# Patient Record
Sex: Female | Born: 1995 | Race: Black or African American | Hispanic: No | Marital: Single | Smoking: Never smoker
Health system: Southern US, Community
[De-identification: ages and names within clinical notes are randomized; demographics above are authoritative.]

## PROBLEM LIST (undated history)

## (undated) DIAGNOSIS — O139 Gestational [pregnancy-induced] hypertension without significant proteinuria, unspecified trimester: Secondary | ICD-10-CM

## (undated) DIAGNOSIS — R87629 Unspecified abnormal cytological findings in specimens from vagina: Secondary | ICD-10-CM

## (undated) DIAGNOSIS — Z789 Other specified health status: Secondary | ICD-10-CM

## (undated) HISTORY — DX: Unspecified abnormal cytological findings in specimens from vagina: R87.629

## (undated) HISTORY — PX: TONSILLECTOMY: SUR1361

## (undated) HISTORY — DX: Other specified health status: Z78.9

## (undated) HISTORY — PX: WISDOM TOOTH EXTRACTION: SHX21

## (undated) HISTORY — DX: Gestational (pregnancy-induced) hypertension without significant proteinuria, unspecified trimester: O13.9

---
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
## 2016-10-23 ENCOUNTER — Encounter (HOSPITAL_BASED_OUTPATIENT_CLINIC_OR_DEPARTMENT_OTHER): Payer: Self-pay | Admitting: *Deleted

## 2016-10-23 ENCOUNTER — Emergency Department (HOSPITAL_BASED_OUTPATIENT_CLINIC_OR_DEPARTMENT_OTHER)
Admission: EM | Admit: 2016-10-23 | Discharge: 2016-10-23 | Disposition: A | Discharge status: Home / Self Care | Payer: Medicaid Other | Attending: Emergency Medicine | Admitting: Emergency Medicine

## 2016-10-23 DIAGNOSIS — R55 Syncope and collapse: Secondary | ICD-10-CM

## 2016-10-23 DIAGNOSIS — D509 Iron deficiency anemia, unspecified: Secondary | ICD-10-CM | POA: Insufficient documentation

## 2016-10-23 LAB — BASIC METABOLIC PANEL
ANION GAP: 5 (ref 5–15)
BUN: 15 mg/dL (ref 6–20)
CHLORIDE: 103 mmol/L (ref 101–111)
CO2: 28 mmol/L (ref 22–32)
Calcium: 9 mg/dL (ref 8.9–10.3)
Creatinine, Ser: 0.76 mg/dL (ref 0.44–1.00)
GFR calc non Af Amer: >60 mL/min (ref >60)
Glucose, Bld: 110 mg/dL — ABNORMAL HIGH (ref 65–99)
POTASSIUM: 3.9 mmol/L (ref 3.5–5.1)
SODIUM: 136 mmol/L (ref 135–145)

## 2016-10-23 LAB — CBC WITH DIFFERENTIAL/PLATELET
BASOS PCT: 0 %
Basophils Absolute: 0 10*3/uL (ref 0.0–0.1)
Eosinophils Absolute: 0.3 10*3/uL (ref 0.0–0.7)
Eosinophils Relative: 3 %
HCT: 31.7 % — ABNORMAL LOW (ref 36.0–46.0)
HEMOGLOBIN: 10.1 g/dL — AB (ref 12.0–15.0)
LYMPHS PCT: 22 %
Lymphs Abs: 1.9 10*3/uL (ref 0.7–4.0)
MCH: 21.1 pg — AB (ref 26.0–34.0)
MCHC: 31.9 g/dL (ref 30.0–36.0)
MCV: 66.3 fL — AB (ref 78.0–100.0)
MONO ABS: 0.7 10*3/uL (ref 0.1–1.0)
MONOS PCT: 8 %
NEUTROS PCT: 67 %
Neutro Abs: 5.7 10*3/uL (ref 1.7–7.7)
PLATELETS: 321 10*3/uL (ref 150–400)
RBC: 4.78 MIL/uL (ref 3.87–5.11)
RDW: 16 % — ABNORMAL HIGH (ref 11.5–15.5)
WBC: 8.6 10*3/uL (ref 4.0–10.5)

## 2016-10-23 LAB — PREGNANCY, URINE: Preg Test, Ur: NEGATIVE

## 2016-10-23 MED ORDER — SODIUM CHLORIDE 0.9 % IV BOLUS (SEPSIS)
500.0000 mL | Freq: Once | INTRAVENOUS | Status: AC
  Administered 2016-10-23: 500 mL via INTRAVENOUS

## 2016-10-23 NOTE — Discharge Instructions (Signed)
Your hemoglobin was 10 today, making you anemic.  Please start taking an over the counter iron supplement. Get rechecked immediately if you develop new or worrisome symptoms.

## 2016-10-23 NOTE — ED Provider Notes (Signed)
[facility]   CSN: [id number] Arrival date & time: 10/23/16  0810     History   Chief Complaint Chief Complaint  Patient presents with  . Dizziness    HPI Joanne Harper is a 21 y.o. female.  The history is provided by the patient. No language interpreter was used.  Dizziness   Joanne Harper is a 21 y.o. female who presents to the Emergency Department complaining of near syncope, dizziness.  This Defrain reports near syncopal sensation and dizziness earlier today. When she woke up and got out of bed she felt lightheaded and felt like she might sound. She went to use the bathroom and felt nauseous and lightheaded again. She did start her menses last night but states it is lighter than usual. No chest pain, shortness of breath, abdominal pain, vomiting, leg swelling or pain. She has a history of anemia but no other medical problems. She had similar symptoms in the past and states they were related to dehydration. She is not sexually active. Overall her dizziness is feeling improved. History reviewed. No pertinent past medical history.  There are no active problems to display for this patient.   History reviewed. No pertinent surgical history.  OB History    No data available       Home Medications    Prior to Admission medications   Not on File    Family History History reviewed. No pertinent family history.  Social History Social History  Substance Use Topics  . Smoking status: Never Smoker  . Smokeless tobacco: Never Used  . Alcohol use No     Allergies   Patient has no known allergies.   Review of Systems Review of Systems  Neurological: Positive for dizziness.  All other systems reviewed and are negative.    Physical Exam Updated Vital Signs BP 134/81 (BP Location: Right Arm)   Pulse 81   Temp 98.1 F (36.7 C) (Oral)   Resp 18   Ht  (1.676 m)   Wt 266 lb (120.7 kg)   LMP 10/22/2016   SpO2 100%   BMI 42.93  kg/m   Physical Exam  Constitutional: She is oriented to person, place, and time. She appears well-developed and well-nourished.  HENT:  Head: Normocephalic and atraumatic.  Cardiovascular: Normal rate and regular rhythm.   No murmur heard. Pulmonary/Chest: Effort normal and breath sounds normal. No respiratory distress.  Abdominal: Soft. There is no tenderness. There is no rebound and no guarding.  Musculoskeletal: She exhibits no edema or tenderness.  Neurological: She is alert and oriented to person, place, and time.  Skin: Skin is warm and dry.  Psychiatric: She has a normal mood and affect. Her behavior is normal.  Nursing note and vitals reviewed.    ED Treatments / Results  Labs (all labs ordered are listed, but only abnormal results are displayed) Labs Reviewed  BASIC METABOLIC PANEL - Abnormal; Notable for the following:       Result Value   Glucose, Bld 110 (*)    All other components within normal limits  CBC WITH DIFFERENTIAL/PLATELET - Abnormal; Notable for the following:    Hemoglobin 10.1 (*)    HCT 31.7 (*)    MCV 66.3 (*)    MCH 21.1 (*)    RDW 16.0 (*)    All other components within normal limits  PREGNANCY, URINE    EKG  EKG Interpretation  Date/Time:  Monday October 23 2016 08:49:08 EDT  Ventricular Rate:  72 PR Interval:    QRS Duration: 93 QT Interval:  377 QTC Calculation: 413 R Axis:   78 Text Interpretation:  Sinus rhythm Baseline wander in lead(s) II V6 Confirmed by Lincoln Brigham [phone]) on 10/23/2016 9:01:55 AM       Radiology No results found.  Procedures Procedures (including critical care time)  Medications Ordered in ED Medications  sodium chloride 0.9 % bolus 500 mL (500 mLs Intravenous New Bag/Given 10/23/16 0900)     Initial Impression / Assessment and Plan / ED Course  I have reviewed the triage vital signs and the nursing notes.  Pertinent labs & imaging results that were available during my care of the patient were  reviewed by me and considered in my medical decision making (see chart for details).     Patient here for a near syncopal episode that happened prior to ED arrival. She is asymptomatic in the emergency department. Presentation is not consistent with PE, cardiac arrhythmia. CBC demonstrates anemia, slightly worse compared to priors. No hemorrhage based on patient history or presentation.  D/w patient home care for near syncope as well as anemia, likely iron deficient. Discussed outpatient follow-up and return precautions.  Final Clinical Impressions(s) / ED Diagnoses   Final diagnoses:  Near syncope  Microcytic anemia    New Prescriptions New Prescriptions   No medications on file     Tilden Fossa, MD 10/23/16 [phone]

## 2016-10-23 NOTE — ED Triage Notes (Signed)
Pt reports starting her menses last night, "and it always makes me feel bad", this am felt fine when she got up, but when she went into the bathroom she noticed that she was feeling dizzy. Denies any n/v/fevers, denies any uti, has used one pad this am, reports "light bleeding".

## 2017-07-03 ENCOUNTER — Other Ambulatory Visit: Payer: Self-pay

## 2017-07-03 ENCOUNTER — Emergency Department (HOSPITAL_BASED_OUTPATIENT_CLINIC_OR_DEPARTMENT_OTHER)
Admission: EM | Admit: 2017-07-03 | Discharge: 2017-07-03 | Disposition: A | Discharge status: Home / Self Care | Payer: Self-pay | Attending: Emergency Medicine | Admitting: Emergency Medicine

## 2017-07-03 ENCOUNTER — Encounter (HOSPITAL_BASED_OUTPATIENT_CLINIC_OR_DEPARTMENT_OTHER): Payer: Self-pay | Admitting: Emergency Medicine

## 2017-07-03 ENCOUNTER — Emergency Department (HOSPITAL_BASED_OUTPATIENT_CLINIC_OR_DEPARTMENT_OTHER): Payer: Self-pay

## 2017-07-03 DIAGNOSIS — J189 Pneumonia, unspecified organism: Secondary | ICD-10-CM | POA: Insufficient documentation

## 2017-07-03 DIAGNOSIS — J181 Lobar pneumonia, unspecified organism: Secondary | ICD-10-CM

## 2017-07-03 LAB — PREGNANCY, URINE: Preg Test, Ur: NEGATIVE

## 2017-07-03 MED ORDER — AZITHROMYCIN 250 MG PO TABS: ORAL_TABLET | ORAL | 0 refills | Status: DC

## 2017-07-03 MED ORDER — CEFTRIAXONE SODIUM 1 G IJ SOLR
1.0000 g | Freq: Once | INTRAMUSCULAR | Status: AC
  Administered 2017-07-03: 1 g via INTRAMUSCULAR
  Filled 2017-07-03: qty 10

## 2017-07-03 MED ORDER — BENZONATATE 100 MG PO CAPS: 100.0000 mg | ORAL_CAPSULE | Freq: Three times a day (TID) | ORAL | 0 refills | Status: DC

## 2017-07-03 MED ORDER — BENZONATATE 100 MG PO CAPS
100.0000 mg | ORAL_CAPSULE | Freq: Once | ORAL | Status: AC
  Administered 2017-07-03: 100 mg via ORAL
  Filled 2017-07-03: qty 1

## 2017-07-03 MED ORDER — LIDOCAINE HCL (PF) 1 % IJ SOLN
INTRAMUSCULAR | Status: AC
  Administered 2017-07-03: 2.1 mL
  Filled 2017-07-03: qty 5

## 2017-07-03 NOTE — Discharge Instructions (Signed)
Tessalon for cough. Motrin or tylenol for fever.

## 2017-07-03 NOTE — ED Provider Notes (Signed)
[facility] EMERGENCY DEPARTMENT Provider Note   CSN: [id number] Arrival date & time: 07/03/17  1249     History   Chief Complaint Chief Complaint  Patient presents with  . Cough    HPI Joanne Harper is a 21 y.o. female. 2 complaint is cough and fever.  HPI 21 year old female. Presents healthy. Started with cough 4 days ago. Shakes and chills yesterday. Nonproductive cough. States she feels short of breath when she exerts. No hemoptysis. She was constipated but did have a bowel movement this morning. Did not get a flu vaccination.  History reviewed. No pertinent past medical history.  There are no active problems to display for this patient.   History reviewed. No pertinent surgical history.  OB History    No data available       Home Medications    Prior to Admission medications   Medication Sig Start Date End Date Taking? Authorizing Provider  azithromycin (ZITHROMAX Z-PAK) 250 MG tablet As directed 07/03/17   Rolland PorterJames, Ramie Palladino, MD  benzonatate (TESSALON) 100 MG capsule Take 1 capsule (100 mg total) by mouth every 8 (eight) hours. 07/03/17   Rolland PorterJames, Odysseus Cada, MD    Family History History reviewed. No pertinent family history.  Social History Social History   Tobacco Use  . Smoking status: Never Smoker  . Smokeless tobacco: Never Used  Substance Use Topics  . Alcohol use: No  . Drug use: No     Allergies   Patient has no known allergies.   Review of Systems Review of Systems  Constitutional: Positive for fever. Negative for appetite change, chills, diaphoresis and fatigue.  HENT: Negative for mouth sores, sore throat and trouble swallowing.   Eyes: Negative for visual disturbance.  Respiratory: Positive for cough and shortness of breath. Negative for chest tightness and wheezing.   Cardiovascular: Negative for chest pain.  Gastrointestinal: Negative for abdominal distention, abdominal pain, diarrhea, nausea and vomiting.  Endocrine: Negative for  polydipsia, polyphagia and polyuria.  Genitourinary: Negative for dysuria, frequency and hematuria.  Musculoskeletal: Negative for gait problem.  Skin: Negative for color change, pallor and rash.  Neurological: Negative for dizziness, syncope, light-headedness and headaches.  Hematological: Does not bruise/bleed easily.  Psychiatric/Behavioral: Negative for behavioral problems and confusion.     Physical Exam Updated Vital Signs BP (!) 157/91 (BP Location: Left Arm)   Pulse 88   Temp 99.6 F (37.6 C) (Oral)   Resp 18   Ht 5\' 7"  (1.702 m)   Wt 123.4 kg (272 lb)   LMP 05/20/2017   SpO2 98%   BMI 42.60 kg/m   Physical Exam  Constitutional: She is oriented to person, place, and time. She appears well-developed and well-nourished. No distress.  Oral temp 101.3.  HENT:  Head: Normocephalic.  Eyes: Conjunctivae are normal. Pupils are equal, round, and reactive to light. No scleral icterus.  Neck: Normal range of motion. Neck supple. No thyromegaly present.  Cardiovascular: Normal rate and regular rhythm. Exam reveals no gallop and no friction rub.  No murmur heard. Pulmonary/Chest: Effort normal and breath sounds normal. No respiratory distress. She has no wheezes. She has no rales.  Left basilar rhonchi. No prolongation. No increased work of breathing. Saturating 99%.  Abdominal: Soft. Bowel sounds are normal. She exhibits no distension. There is no tenderness. There is no rebound.  Musculoskeletal: Normal range of motion.  Neurological: She is alert and oriented to person, place, and time.  Skin: Skin is warm and dry. No rash noted.  Psychiatric: She has a normal mood and affect. Her behavior is normal.     ED Treatments / Results  Labs (all labs ordered are listed, but only abnormal results are displayed) Labs Reviewed  PREGNANCY, URINE    EKG  EKG Interpretation None       Radiology Dg Chest 2 View  Result Date: 07/03/2017 CLINICAL DATA:  Cough EXAM: CHEST  2  VIEW COMPARISON:  None. FINDINGS: Patchy atelectasis versus minimal infiltrate at the left base. No pleural effusion. Normal heart size. Negative for a pneumothorax. IMPRESSION: Patchy atelectasis versus minimal infiltrate at the left lung base Electronically Signed   By: Jasmine PangKim  Fujinaga M.D.   On: 07/03/2017 14:05    Procedures Procedures (including critical care time)  Medications Ordered in ED Medications  cefTRIAXone (ROCEPHIN) injection 1 g (1 g Intramuscular Given 07/03/17 1502)  benzonatate (TESSALON) capsule 100 mg (100 mg Oral Given 07/03/17 1502)  lidocaine (PF) (XYLOCAINE) 1 % injection (2.1 mLs  Given 07/03/17 1503)     Initial Impression / Assessment and Plan / ED Course  I have reviewed the triage vital signs and the nursing notes.  Pertinent labs & imaging results that were available during my care of the patient were reviewed by me and considered in my medical decision making (see chart for details).    Discussed increasing fluids and fiber for constipation. She has a benign abdominal exam. Chest x-ray shows left basilar infiltrate. Plan will be I am Rocephin and Zithromax at home. Primary care follow-up. Motrin or Haldol for fever. Tessalon for cough. ER with worsening.  Final Clinical Impressions(s) / ED Diagnoses   Final diagnoses:  Community acquired pneumonia of left lower lobe of lung [facility](HCC)    ED Discharge Orders        Ordered    azithromycin (ZITHROMAX Z-PAK) 250 MG tablet     07/03/17 1534    benzonatate (TESSALON) 100 MG capsule  Every 8 hours     07/03/17 1534       Rolland PorterJames, Khalila Buechner, MD 07/03/17 1546

## 2017-07-03 NOTE — ED Triage Notes (Signed)
Patient states that on Sunday she started to have a cough and "getting winded" on Sunday - the patient also reports that she is having a constipation . Denies any fever - but chills

## 2017-10-09 ENCOUNTER — Encounter (HOSPITAL_BASED_OUTPATIENT_CLINIC_OR_DEPARTMENT_OTHER): Payer: Self-pay | Admitting: Emergency Medicine

## 2017-10-09 ENCOUNTER — Other Ambulatory Visit: Payer: Self-pay

## 2017-10-09 ENCOUNTER — Emergency Department (HOSPITAL_BASED_OUTPATIENT_CLINIC_OR_DEPARTMENT_OTHER)
Admission: EM | Admit: 2017-10-09 | Discharge: 2017-10-09 | Disposition: A | Discharge status: Home / Self Care | Payer: Self-pay | Attending: Emergency Medicine | Admitting: Emergency Medicine

## 2017-10-09 DIAGNOSIS — J039 Acute tonsillitis, unspecified: Secondary | ICD-10-CM | POA: Insufficient documentation

## 2017-10-09 DIAGNOSIS — J302 Other seasonal allergic rhinitis: Secondary | ICD-10-CM | POA: Insufficient documentation

## 2017-10-09 MED ORDER — LORATADINE 10 MG PO TABS: 10.0000 mg | ORAL_TABLET | Freq: Every day | ORAL | 0 refills | Status: DC

## 2017-10-09 MED ORDER — PENICILLIN G BENZATHINE 1200000 UNIT/2ML IM SUSP
1.2000 10*6.[IU] | Freq: Once | INTRAMUSCULAR | Status: AC
  Administered 2017-10-09: 1.2 10*6.[IU] via INTRAMUSCULAR
  Filled 2017-10-09: qty 2

## 2017-10-09 NOTE — ED Triage Notes (Signed)
Pt c/o sore throat x 6 days

## 2017-10-09 NOTE — ED Provider Notes (Signed)
[facility] EMERGENCY DEPARTMENT Provider Note   CSN: [id number] Arrival date & time: 10/09/17  1010     History   Chief Complaint Chief Complaint  Patient presents with  . Sore Throat    HPI Joanne Harper is a 22 y.o. female.  HPI Patient presents with 6 days of sore throat.  Pain is worse with swallowing.  Denies fever or chills.  She has had nasal congestion and clear rhinorrhea.  Thinks this may be related to pollen.  Denies shortness of breath or cough.  No new rashes.  Younger sister whom she lives with recently developed sore throat.  No other known sick contacts. History reviewed. No pertinent past medical history.  There are no active problems to display for this patient.   History reviewed. No pertinent surgical history.   OB History   None      Home Medications    Prior to Admission medications   Medication Sig Start Date End Date Taking? Authorizing Provider  azithromycin (ZITHROMAX Z-PAK) 250 MG tablet As directed 07/03/17   Rolland Porter, MD  benzonatate (TESSALON) 100 MG capsule Take 1 capsule (100 mg total) by mouth every 8 (eight) hours. 07/03/17   Rolland Porter, MD  loratadine (CLARITIN) 10 MG tablet Take 1 tablet (10 mg total) by mouth daily. 10/09/17   Loren Racer, MD    Family History No family history on file.  Social History Social History   Tobacco Use  . Smoking status: Never Smoker  . Smokeless tobacco: Never Used  Substance Use Topics  . Alcohol use: No  . Drug use: No     Allergies   Patient has no known allergies.   Review of Systems Review of Systems  Constitutional: Negative for chills and fever.  HENT: Positive for congestion, rhinorrhea, sinus pressure and sore throat. Negative for trouble swallowing.   Respiratory: Negative for cough and shortness of breath.   Gastrointestinal: Negative for abdominal pain and nausea.  Musculoskeletal: Positive for neck pain. Negative for arthralgias, myalgias and neck  stiffness.  Skin: Negative for rash.  Neurological: Negative for weakness, light-headedness, numbness and headaches.  All other systems reviewed and are negative.    Physical Exam Updated Vital Signs BP 138/90   Pulse 77   Temp 98.5 F (36.9 C) (Oral)   Resp 16   Ht 5\' 6"  (1.676 m)   Wt 123.8 kg (273 lb)   LMP 10/02/2017   SpO2 99%   BMI 44.06 kg/m   Physical Exam  Constitutional: She is oriented to person, place, and time. She appears well-developed and well-nourished. No distress.  HENT:  Head: Normocephalic and atraumatic.  Mouth/Throat: Oropharynx is clear and moist.  Bilateral tonsillar hypertrophy and erythema.  Uvula is midline.  No exudates appreciated.  Patient does appear to have some small shallow ulcerations of the soft palate and tonsils.  Eyes: Pupils are equal, round, and reactive to light. EOM are normal.  Neck: Normal range of motion. Neck supple.  Right greater than left cervical lymphadenopathy.  No meningismus.  No stridor.  Cardiovascular: Normal rate and regular rhythm.  Pulmonary/Chest: Effort normal and breath sounds normal. She has no wheezes.  Abdominal: Soft. Bowel sounds are normal. There is no tenderness. There is no rebound and no guarding.  Musculoskeletal: Normal range of motion. She exhibits no edema or tenderness.  Lymphadenopathy:    She has cervical adenopathy.  Neurological: She is alert and oriented to person, place, and time.  Skin: Skin  is warm and dry. No rash noted. She is not diaphoretic. No erythema.  Psychiatric: She has a normal mood and affect. Her behavior is normal.  Nursing note and vitals reviewed.    ED Treatments / Results  Labs (all labs ordered are listed, but only abnormal results are displayed) Labs Reviewed - No data to display  EKG None  Radiology No results found.  Procedures Procedures (including critical care time)  Medications Ordered in ED Medications  penicillin g benzathine (BICILLIN LA)  1200000 UNIT/2ML injection 1.2 Million Units (has no administration in time range)     Initial Impression / Assessment and Plan / ED Course  I have reviewed the triage vital signs and the nursing notes.  Pertinent labs & imaging results that were available during my care of the patient were reviewed by me and considered in my medical decision making (see chart for details).     Patient with allergic rhinitis and tonsillitis.  Given sister has similar symptoms and probable strep throat, will treat with IM penicillin.  Also placed on Claritin.  Advised to continue ibuprofen and Tylenol as needed for pain.  Return precautions given.  Final Clinical Impressions(s) / ED Diagnoses   Final diagnoses:  Tonsillitis  Seasonal allergic rhinitis, unspecified trigger    ED Discharge Orders        Ordered    loratadine (CLARITIN) 10 MG tablet  Daily     10/09/17 1049       Loren RacerYelverton, Jaidan Stachnik, MD 10/09/17 1055

## 2018-01-31 ENCOUNTER — Emergency Department (HOSPITAL_COMMUNITY)
Admission: EM | Admit: 2018-01-31 | Discharge: 2018-02-01 | Disposition: A | Discharge status: Home / Self Care | Payer: Medicaid Other | Attending: Emergency Medicine | Admitting: Emergency Medicine

## 2018-01-31 ENCOUNTER — Encounter (HOSPITAL_COMMUNITY): Payer: Self-pay

## 2018-01-31 DIAGNOSIS — D509 Iron deficiency anemia, unspecified: Secondary | ICD-10-CM | POA: Insufficient documentation

## 2018-01-31 DIAGNOSIS — R197 Diarrhea, unspecified: Secondary | ICD-10-CM

## 2018-01-31 DIAGNOSIS — R634 Abnormal weight loss: Secondary | ICD-10-CM

## 2018-01-31 DIAGNOSIS — R1030 Lower abdominal pain, unspecified: Secondary | ICD-10-CM

## 2018-01-31 DIAGNOSIS — Z79899 Other long term (current) drug therapy: Secondary | ICD-10-CM | POA: Insufficient documentation

## 2018-01-31 LAB — CBC WITH DIFFERENTIAL/PLATELET
ABS IMMATURE GRANULOCYTES: 0 10*3/uL (ref 0.0–0.1)
BASOS ABS: 0 10*3/uL (ref 0.0–0.1)
BASOS PCT: 0 %
Eosinophils Absolute: 0.2 10*3/uL (ref 0.0–0.7)
Eosinophils Relative: 2 %
HEMATOCRIT: 36.1 % (ref 36.0–46.0)
HEMOGLOBIN: 11 g/dL — AB (ref 12.0–15.0)
IMMATURE GRANULOCYTES: 1 %
Lymphocytes Relative: 33 %
Lymphs Abs: 2.6 10*3/uL (ref 0.7–4.0)
MCH: 21.3 pg — ABNORMAL LOW (ref 26.0–34.0)
MCHC: 30.5 g/dL (ref 30.0–36.0)
MCV: 70 fL — ABNORMAL LOW (ref 78.0–100.0)
MONOS PCT: 7 %
Monocytes Absolute: 0.6 10*3/uL (ref 0.1–1.0)
Neutro Abs: 4.6 10*3/uL (ref 1.7–7.7)
Neutrophils Relative %: 57 %
Platelets: 310 10*3/uL (ref 150–400)
RBC: 5.16 MIL/uL — ABNORMAL HIGH (ref 3.87–5.11)
RDW: 15.3 % (ref 11.5–15.5)
WBC: 8 10*3/uL (ref 4.0–10.5)

## 2018-01-31 LAB — I-STAT BETA HCG BLOOD, ED (MC, WL, AP ONLY): I-stat hCG, quantitative: <5 m[IU]/mL (ref <5)

## 2018-01-31 LAB — URINALYSIS, ROUTINE W REFLEX MICROSCOPIC
Bacteria, UA: NONE SEEN
Bilirubin Urine: NEGATIVE
GLUCOSE, UA: NEGATIVE mg/dL
Hgb urine dipstick: NEGATIVE
Ketones, ur: NEGATIVE mg/dL
NITRITE: NEGATIVE
PH: 6 (ref 5.0–8.0)
Protein, ur: NEGATIVE mg/dL
SPECIFIC GRAVITY, URINE: 1.026 (ref 1.005–1.030)

## 2018-01-31 LAB — COMPREHENSIVE METABOLIC PANEL
ALT: 14 U/L (ref 0–44)
AST: 14 U/L — ABNORMAL LOW (ref 15–41)
Albumin: 3.8 g/dL (ref 3.5–5.0)
Alkaline Phosphatase: 58 U/L (ref 38–126)
Anion gap: 10 (ref 5–15)
BUN: 16 mg/dL (ref 6–20)
CALCIUM: 9.3 mg/dL (ref 8.9–10.3)
CO2: 24 mmol/L (ref 22–32)
Chloride: 105 mmol/L (ref 98–111)
Creatinine, Ser: 0.9 mg/dL (ref 0.44–1.00)
GFR calc non Af Amer: >60 mL/min (ref >60)
Glucose, Bld: 100 mg/dL — ABNORMAL HIGH (ref 70–99)
Potassium: 4.2 mmol/L (ref 3.5–5.1)
Sodium: 139 mmol/L (ref 135–145)
Total Bilirubin: 0.6 mg/dL (ref 0.3–1.2)
Total Protein: 7.5 g/dL (ref 6.5–8.1)

## 2018-01-31 LAB — LIPASE, BLOOD: LIPASE: 26 U/L (ref 11–51)

## 2018-01-31 NOTE — ED Triage Notes (Signed)
Pt reports that she has had a recent weight loss of 40 lbs in the past month, the last three days has been having diarrhea, no abd pain or fevers, some nausea. Pt tearful in triage and reports stress at home

## 2018-01-31 NOTE — ED Provider Notes (Signed)
[facility] EMERGENCY DEPARTMENT Provider Note   CSN: [id number] Arrival date & time: 01/31/18  2040     History   Chief Complaint Chief Complaint  Patient presents with  . Weight Loss  . Diarrhea    HPI Joanne Harper is a 22 y.o. female.  The history is provided by the patient.  She has noted some anorexia and weight loss over the last month.  She estimates a 40 pound weight loss.  Over the last 3 days, she has had some crampy suprapubic pain and diarrhea.  She is having approximately 1 watery bowel movement today.  Abdominal pain does ease following with bowel movement, but then does come back.  There has been some mild nausea but no vomiting.  She is worried about possible pregnancy.  She does use condoms for contraception.  Last menses was approximately 1 month ago.  She has noted some breast tenderness.  She denies any fever or chills.  She denies any blood or mucus in the stool.  She has been under a lot of stress.  History reviewed. No pertinent past medical history.  There are no active problems to display for this patient.   History reviewed. No pertinent surgical history.   OB History   None      Home Medications    Prior to Admission medications   Medication Sig Start Date End Date Taking? Authorizing Provider  azithromycin (ZITHROMAX Z-PAK) 250 MG tablet As directed 07/03/17   Rolland Porter, MD  benzonatate (TESSALON) 100 MG capsule Take 1 capsule (100 mg total) by mouth every 8 (eight) hours. 07/03/17   Rolland Porter, MD  loratadine (CLARITIN) 10 MG tablet Take 1 tablet (10 mg total) by mouth daily. 10/09/17   Loren Racer, MD    Family History No family history on file.  Social History Social History   Tobacco Use  . Smoking status: Never Smoker  . Smokeless tobacco: Never Used  Substance Use Topics  . Alcohol use: No  . Drug use: No     Allergies   Penicillins   Review of Systems Review of Systems  All other systems  reviewed and are negative.    Physical Exam Updated Vital Signs BP (!) 133/96 (BP Location: Right Arm)   Pulse 75   Temp 99.1 F (37.3 C)   Resp 18   LMP 01/07/2018   SpO2 100%   Physical Exam  Nursing note and vitals reviewed.  21 year old female, resting comfortably and in no acute distress. Vital signs are significant for mild diastolic blood pressure elevation. Oxygen saturation is 100%, which is normal. Head is normocephalic and atraumatic. PERRLA, EOMI. Oropharynx is clear. Neck is nontender and supple without adenopathy or JVD. Back is nontender and there is no CVA tenderness. Lungs are clear without rales, wheezes, or rhonchi. Chest is nontender. Heart has regular rate and rhythm without murmur. Abdomen is soft, flat, nontender without masses or hepatosplenomegaly and peristalsis is normoactive. Extremities have no cyanosis or edema, full range of motion is present. Skin is warm and dry without rash. Neurologic: Mental status is normal, cranial nerves are intact, there are no motor or sensory deficits.  ED Treatments / Results  Labs (all labs ordered are listed, but only abnormal results are displayed) Labs Reviewed  COMPREHENSIVE METABOLIC PANEL - Abnormal; Notable for the following components:      Result Value   Glucose, Bld 100 (*)    AST 14 (*)  All other components within normal limits  CBC WITH DIFFERENTIAL/PLATELET - Abnormal; Notable for the following components:   RBC 5.16 (*)    Hemoglobin 11.0 (*)    MCV 70.0 (*)    MCH 21.3 (*)    All other components within normal limits  URINALYSIS, ROUTINE W REFLEX MICROSCOPIC - Abnormal; Notable for the following components:   Leukocytes, UA SMALL (*)    All other components within normal limits  LIPASE, BLOOD  I-STAT BETA HCG BLOOD, ED ([facility], [facility], [facility] ONLY)   Procedures Procedures   Medications Ordered in ED Medications - No data to display   Initial Impression / Assessment and Plan / ED Course  I have  reviewed the triage vital signs and the nursing notes.  Pertinent lab results that were available during my care of the patient were reviewed by me and considered in my medical decision making (see chart for details).  Weight loss, abdominal pain, diarrhea of uncertain cause.  Screening labs have been obtained prior to my seeing her and are significant only for mild microcytic anemia which is presumably secondary to menstrual blood loss.  Hemoglobin is stable over approximately 7-year period.  She is not pregnant.  I have explained these findings to the patient.  Cause for her diarrhea and weight loss are not clear.  Recommended that she avoid milk products as doctor's intolerance could cause some of her symptoms.  She is referred to gastroenterology for further outpatient work-up.  Old records are reviewed, and she has no relevant past visits.  Final Clinical Impressions(s) / ED Diagnoses   Final diagnoses:  Diarrhea, unspecified type  Weight loss  Lower abdominal pain  Microcytic anemia    ED Discharge Orders    None       Dione BoozeGlick, Marchia Diguglielmo, MD 02/01/18 [phone]

## 2018-01-31 NOTE — ED Notes (Signed)
ED Provider at bedside. 

## 2018-01-31 NOTE — ED Provider Notes (Signed)
MSE was initiated and I personally evaluated the patient and placed orders (if any) at  8:53 PM on January 31, 2018.  The patient appears stable so that the remainder of the MSE may be completed by another provider.  Patient placed in Quick Look pathway, seen and evaluated   Chief Complaint: Diarrhea x3 days  HPI:   [age] year old female presents to ED for evaluation of diarrhea for the past 3 days, decreased appetite for the past month.  States that she has had a 40 pound weight loss this past month.  She attributes this to increased stressors in her life and "just forgetting to eat, I have to make myself eat sometimes."  She denies any hematochezia or melena.  Reports nausea with no vomiting.  No sick contacts with similar symptoms.  Patient states that she has her mother and sister at home for support.  She denies any SI, HI.  ROS: Diarrhea  Physical Exam:   Gen: No distress  Neuro: Awake and Alert  Skin: Warm    Focused Exam: No abdominal tenderness to palpation.  Tachycardic.  Tearful during exam.   Initiation of care has begun. The patient has been counseled on the process, plan, and necessity for staying for the completion/evaluation, and the remainder of the medical screening examination    Dietrich PatesKhatri, Aryahi Denzler, PA-C 01/31/18 2055    Jacalyn LefevreHaviland, Julie, MD 01/31/18 2126

## 2018-02-01 ENCOUNTER — Other Ambulatory Visit: Payer: Self-pay

## 2018-02-01 NOTE — Discharge Instructions (Addendum)
Avoid milk products - including cheese, ice cream, yogurt, etc.

## 2019-07-11 NOTE — L&D Delivery Note (Signed)
Delivery Note Joanne Harper is a G1P0 at [redacted]w[redacted]d who had a spontaneous delivery at 15:06 on 03/24/20,  a viable female "Joanne Harper" was delivered via ROA.  APGAR: 9, 9; weight 2145g (4lb11.7oz).   Joanne Harper was admitted for IOL due to [facility], diagnosed with preeclampsia on admission by [facility]. Induced with cytotec x2, then SROM'ed, and pitocin was given. Progressed normally. Pushed for 5 minutes. Baby was delivered without difficulty. No nuchal cord. Placed on maternal abdomen. Delayed cord clamping for 60 seconds. Delivery of placenta was spontaneous. Placenta was found to be intact, 3-vessel cord was noted. The fundus was found to be firm. 1st degree perineal laceration was repaired with 2-0 vicryl in a figure of 8 fashion. Estimated blood loss 150cc. Instrument and gauze counts were correct at the end of the procedure. She did not receive an epidural for pain management.  Placenta status: pathology Mom to postpartum.  Baby to Couplet care / Skin to Skin.  Joanne Harper 03/24/2020, 3:27 PM

## 2019-10-03 LAB — OB RESULTS CONSOLE RUBELLA ANTIBODY, IGM: Rubella: IMMUNE

## 2019-10-03 LAB — OB RESULTS CONSOLE HIV ANTIBODY (ROUTINE TESTING): HIV: NONREACTIVE

## 2019-10-03 LAB — OB RESULTS CONSOLE ANTIBODY SCREEN: Antibody Screen: NEGATIVE

## 2019-10-03 LAB — OB RESULTS CONSOLE GC/CHLAMYDIA
Chlamydia: NEGATIVE
Gonorrhea: NEGATIVE

## 2019-10-03 LAB — OB RESULTS CONSOLE HEPATITIS B SURFACE ANTIGEN: Hepatitis B Surface Ag: NEGATIVE

## 2019-10-03 LAB — OB RESULTS CONSOLE RPR: RPR: NONREACTIVE

## 2019-10-03 LAB — OB RESULTS CONSOLE ABO/RH: RH Type: POSITIVE

## 2020-01-23 LAB — OB RESULTS CONSOLE RPR: RPR: NONREACTIVE

## 2020-02-17 ENCOUNTER — Encounter: Payer: Self-pay | Admitting: *Deleted

## 2020-02-17 ENCOUNTER — Other Ambulatory Visit: Payer: Self-pay | Admitting: *Deleted

## 2020-02-17 ENCOUNTER — Other Ambulatory Visit: Payer: Self-pay | Admitting: Obstetrics and Gynecology

## 2020-02-17 ENCOUNTER — Other Ambulatory Visit: Payer: Self-pay

## 2020-02-17 ENCOUNTER — Ambulatory Visit: Payer: Medicaid Other | Admitting: *Deleted

## 2020-02-17 ENCOUNTER — Ambulatory Visit: Payer: Medicaid Other | Attending: Obstetrics and Gynecology

## 2020-02-17 VITALS — BP 133/85 | HR 92

## 2020-02-17 DIAGNOSIS — O99213 Obesity complicating pregnancy, third trimester: Secondary | ICD-10-CM | POA: Diagnosis not present

## 2020-02-17 DIAGNOSIS — Z364 Encounter for antenatal screening for fetal growth retardation: Secondary | ICD-10-CM

## 2020-02-17 DIAGNOSIS — O365990 Maternal care for other known or suspected poor fetal growth, unspecified trimester, not applicable or unspecified: Secondary | ICD-10-CM | POA: Diagnosis not present

## 2020-02-17 DIAGNOSIS — Z3A31 31 weeks gestation of pregnancy: Secondary | ICD-10-CM

## 2020-02-17 DIAGNOSIS — Z362 Encounter for other antenatal screening follow-up: Secondary | ICD-10-CM

## 2020-02-17 DIAGNOSIS — E669 Obesity, unspecified: Secondary | ICD-10-CM | POA: Diagnosis not present

## 2020-03-09 ENCOUNTER — Other Ambulatory Visit: Payer: Self-pay | Admitting: *Deleted

## 2020-03-09 ENCOUNTER — Ambulatory Visit: Payer: Medicaid Other | Admitting: *Deleted

## 2020-03-09 ENCOUNTER — Ambulatory Visit: Payer: Medicaid Other | Attending: Obstetrics and Gynecology

## 2020-03-09 ENCOUNTER — Other Ambulatory Visit: Payer: Self-pay

## 2020-03-09 ENCOUNTER — Encounter: Payer: Self-pay | Admitting: *Deleted

## 2020-03-09 VITALS — BP 140/96 | HR 73

## 2020-03-09 DIAGNOSIS — Z362 Encounter for other antenatal screening follow-up: Secondary | ICD-10-CM | POA: Insufficient documentation

## 2020-03-09 DIAGNOSIS — Z3A34 34 weeks gestation of pregnancy: Secondary | ICD-10-CM

## 2020-03-09 DIAGNOSIS — O99213 Obesity complicating pregnancy, third trimester: Secondary | ICD-10-CM

## 2020-03-09 DIAGNOSIS — Z3689 Encounter for other specified antenatal screening: Secondary | ICD-10-CM | POA: Diagnosis present

## 2020-03-09 DIAGNOSIS — E669 Obesity, unspecified: Secondary | ICD-10-CM

## 2020-03-09 DIAGNOSIS — Z364 Encounter for antenatal screening for fetal growth retardation: Secondary | ICD-10-CM | POA: Diagnosis not present

## 2020-03-18 LAB — OB RESULTS CONSOLE GBS: GBS: NEGATIVE

## 2020-03-19 ENCOUNTER — Telehealth (HOSPITAL_COMMUNITY): Payer: Self-pay | Admitting: *Deleted

## 2020-03-19 ENCOUNTER — Encounter (HOSPITAL_COMMUNITY): Payer: Self-pay | Admitting: *Deleted

## 2020-03-19 NOTE — Telephone Encounter (Signed)
Preadmission screen  

## 2020-03-22 ENCOUNTER — Other Ambulatory Visit (HOSPITAL_COMMUNITY)
Admission: RE | Admit: 2020-03-22 | Discharge: 2020-03-22 | Disposition: A | Discharge status: Home / Self Care | Payer: Medicaid Other | Source: Ambulatory Visit | Attending: Obstetrics & Gynecology | Admitting: Obstetrics & Gynecology

## 2020-03-22 DIAGNOSIS — Z20822 Contact with and (suspected) exposure to covid-19: Secondary | ICD-10-CM | POA: Diagnosis not present

## 2020-03-22 DIAGNOSIS — Z01812 Encounter for preprocedural laboratory examination: Secondary | ICD-10-CM | POA: Insufficient documentation

## 2020-03-22 DIAGNOSIS — O139 Gestational [pregnancy-induced] hypertension without significant proteinuria, unspecified trimester: Secondary | ICD-10-CM | POA: Diagnosis present

## 2020-03-22 LAB — SARS CORONAVIRUS 2 (TAT 6-24 HRS): SARS Coronavirus 2: NEGATIVE

## 2020-03-23 ENCOUNTER — Other Ambulatory Visit: Payer: Self-pay | Admitting: Obstetrics and Gynecology

## 2020-03-24 ENCOUNTER — Other Ambulatory Visit: Payer: Self-pay

## 2020-03-24 ENCOUNTER — Encounter (HOSPITAL_COMMUNITY): Payer: Self-pay | Admitting: Obstetrics & Gynecology

## 2020-03-24 ENCOUNTER — Inpatient Hospital Stay (HOSPITAL_COMMUNITY)
Admission: AD | Admit: 2020-03-24 | Discharge: 2020-03-27 | DRG: 807 | Disposition: A | Discharge status: Home / Self Care | Payer: Medicaid Other | Attending: Obstetrics & Gynecology | Admitting: Obstetrics & Gynecology

## 2020-03-24 ENCOUNTER — Inpatient Hospital Stay (HOSPITAL_COMMUNITY): Payer: Medicaid Other

## 2020-03-24 DIAGNOSIS — O133 Gestational [pregnancy-induced] hypertension without significant proteinuria, third trimester: Secondary | ICD-10-CM

## 2020-03-24 DIAGNOSIS — O1414 Severe pre-eclampsia complicating childbirth: Secondary | ICD-10-CM | POA: Diagnosis present

## 2020-03-24 DIAGNOSIS — Z3A37 37 weeks gestation of pregnancy: Secondary | ICD-10-CM | POA: Diagnosis not present

## 2020-03-24 DIAGNOSIS — O9902 Anemia complicating childbirth: Secondary | ICD-10-CM | POA: Diagnosis present

## 2020-03-24 DIAGNOSIS — O139 Gestational [pregnancy-induced] hypertension without significant proteinuria, unspecified trimester: Secondary | ICD-10-CM | POA: Diagnosis present

## 2020-03-24 DIAGNOSIS — O1493 Unspecified pre-eclampsia, third trimester: Secondary | ICD-10-CM

## 2020-03-24 DIAGNOSIS — O99214 Obesity complicating childbirth: Secondary | ICD-10-CM | POA: Diagnosis present

## 2020-03-24 DIAGNOSIS — O1490 Unspecified pre-eclampsia, unspecified trimester: Secondary | ICD-10-CM | POA: Diagnosis not present

## 2020-03-24 DIAGNOSIS — E669 Obesity, unspecified: Secondary | ICD-10-CM | POA: Diagnosis present

## 2020-03-24 LAB — COMPREHENSIVE METABOLIC PANEL
ALT: 17 U/L (ref 0–44)
ALT: 17 U/L (ref 0–44)
AST: 32 U/L (ref 15–41)
AST: 20 U/L (ref 15–41)
Albumin: 2.7 g/dL — ABNORMAL LOW (ref 3.5–5.0)
Albumin: 2.4 g/dL — ABNORMAL LOW (ref 3.5–5.0)
Alkaline Phosphatase: 138 U/L — ABNORMAL HIGH (ref 38–126)
Alkaline Phosphatase: 142 U/L — ABNORMAL HIGH (ref 38–126)
Anion gap: 10 (ref 5–15)
Anion gap: 8 (ref 5–15)
BUN: 15 mg/dL (ref 6–20)
BUN: 10 mg/dL (ref 6–20)
CO2: 19 mmol/L — ABNORMAL LOW (ref 22–32)
CO2: 20 mmol/L — ABNORMAL LOW (ref 22–32)
Calcium: 8.8 mg/dL — ABNORMAL LOW (ref 8.9–10.3)
Calcium: 8.5 mg/dL — ABNORMAL LOW (ref 8.9–10.3)
Chloride: 106 mmol/L (ref 98–111)
Chloride: 102 mmol/L (ref 98–111)
Creatinine, Ser: 0.9 mg/dL (ref 0.44–1.00)
Creatinine, Ser: 0.73 mg/dL (ref 0.44–1.00)
GFR calc Af Amer: >60 mL/min (ref >60)
GFR calc Af Amer: >60 mL/min (ref >60)
GFR calc non Af Amer: >60 mL/min (ref >60)
GFR calc non Af Amer: >60 mL/min (ref >60)
Glucose, Bld: 86 mg/dL (ref 70–99)
Glucose, Bld: 126 mg/dL — ABNORMAL HIGH (ref 70–99)
Potassium: 4.8 mmol/L (ref 3.5–5.1)
Potassium: 4.4 mmol/L (ref 3.5–5.1)
Sodium: 135 mmol/L (ref 135–145)
Sodium: 130 mmol/L — ABNORMAL LOW (ref 135–145)
Total Bilirubin: 0.5 mg/dL (ref 0.3–1.2)
Total Bilirubin: 0.5 mg/dL (ref 0.3–1.2)
Total Protein: 6.5 g/dL (ref 6.5–8.1)
Total Protein: 5.8 g/dL — ABNORMAL LOW (ref 6.5–8.1)

## 2020-03-24 LAB — TYPE AND SCREEN
ABO/RH(D): O POS
Antibody Screen: NEGATIVE

## 2020-03-24 LAB — CBC
HCT: 30.8 % — ABNORMAL LOW (ref 36.0–46.0)
HCT: 30.8 % — ABNORMAL LOW (ref 36.0–46.0)
HCT: 32.9 % — ABNORMAL LOW (ref 36.0–46.0)
Hemoglobin: 10.1 g/dL — ABNORMAL LOW (ref 12.0–15.0)
Hemoglobin: 9.4 g/dL — ABNORMAL LOW (ref 12.0–15.0)
Hemoglobin: 9.6 g/dL — ABNORMAL LOW (ref 12.0–15.0)
MCH: 21.7 pg — ABNORMAL LOW (ref 26.0–34.0)
MCH: 21.8 pg — ABNORMAL LOW (ref 26.0–34.0)
MCH: 22.5 pg — ABNORMAL LOW (ref 26.0–34.0)
MCHC: 30.5 g/dL (ref 30.0–36.0)
MCHC: 30.7 g/dL (ref 30.0–36.0)
MCHC: 31.2 g/dL (ref 30.0–36.0)
MCV: 70.9 fL — ABNORMAL LOW (ref 80.0–100.0)
MCV: 71.1 fL — ABNORMAL LOW (ref 80.0–100.0)
MCV: 72.3 fL — ABNORMAL LOW (ref 80.0–100.0)
Platelets: 286 10*3/uL (ref 150–400)
Platelets: 300 10*3/uL (ref 150–400)
Platelets: 354 10*3/uL (ref 150–400)
RBC: 4.26 MIL/uL (ref 3.87–5.11)
RBC: 4.33 MIL/uL (ref 3.87–5.11)
RBC: 4.64 MIL/uL (ref 3.87–5.11)
RDW: 16.5 % — ABNORMAL HIGH (ref 11.5–15.5)
RDW: 16.8 % — ABNORMAL HIGH (ref 11.5–15.5)
RDW: 17.1 % — ABNORMAL HIGH (ref 11.5–15.5)
WBC: 10.6 10*3/uL — ABNORMAL HIGH (ref 4.0–10.5)
WBC: 14.3 10*3/uL — ABNORMAL HIGH (ref 4.0–10.5)
WBC: 9.7 10*3/uL (ref 4.0–10.5)
nRBC: 0 % (ref 0.0–0.2)
nRBC: 0 % (ref 0.0–0.2)
nRBC: 0 % (ref 0.0–0.2)

## 2020-03-24 LAB — RPR: RPR Ser Ql: NONREACTIVE

## 2020-03-24 LAB — PROTEIN / CREATININE RATIO, URINE
Creatinine, Urine: 264.96 mg/dL
Protein Creatinine Ratio: 0.35 mg/mg{Cre} — ABNORMAL HIGH (ref 0.00–0.15)
Total Protein, Urine: 92 mg/dL

## 2020-03-24 MED ORDER — LACTATED RINGERS IV SOLN: INTRAVENOUS | Status: DC

## 2020-03-24 MED ORDER — LACTATED RINGERS IV SOLN: 500.0000 mL | Freq: Once | INTRAVENOUS | Status: DC

## 2020-03-24 MED ORDER — HYDRALAZINE HCL 20 MG/ML IJ SOLN: 10.0000 mg | INTRAMUSCULAR | Status: DC | PRN

## 2020-03-24 MED ORDER — DIPHENHYDRAMINE HCL 50 MG/ML IJ SOLN: 12.5000 mg | INTRAMUSCULAR | Status: DC | PRN

## 2020-03-24 MED ORDER — LABETALOL HCL 5 MG/ML IV SOLN: 80.0000 mg | INTRAVENOUS | Status: DC | PRN

## 2020-03-24 MED ORDER — FENTANYL CITRATE (PF) 100 MCG/2ML IJ SOLN
50.0000 ug | INTRAMUSCULAR | Status: DC | PRN
  Administered 2020-03-24 (×2): 100 ug via INTRAVENOUS
  Filled 2020-03-24 (×2): qty 2

## 2020-03-24 MED ORDER — OXYTOCIN-SODIUM CHLORIDE 30-0.9 UT/500ML-% IV SOLN: 2.5000 [IU]/h | INTRAVENOUS | Status: DC

## 2020-03-24 MED ORDER — OXYCODONE HCL 5 MG PO TABS: 5.0000 mg | ORAL_TABLET | ORAL | Status: DC | PRN

## 2020-03-24 MED ORDER — OXYCODONE HCL 5 MG PO TABS: 10.0000 mg | ORAL_TABLET | ORAL | Status: DC | PRN

## 2020-03-24 MED ORDER — COCONUT OIL OIL: 1.0000 "application " | TOPICAL_OIL | Status: DC | PRN

## 2020-03-24 MED ORDER — EPHEDRINE 5 MG/ML INJ: 10.0000 mg | INTRAVENOUS | Status: DC | PRN

## 2020-03-24 MED ORDER — LABETALOL HCL 5 MG/ML IV SOLN: 40.0000 mg | INTRAVENOUS | Status: DC | PRN

## 2020-03-24 MED ORDER — DOCUSATE SODIUM 100 MG PO CAPS
100.0000 mg | ORAL_CAPSULE | Freq: Two times a day (BID) | ORAL | Status: DC
  Administered 2020-03-24 – 2020-03-27 (×6): 100 mg via ORAL
  Filled 2020-03-24 (×6): qty 1

## 2020-03-24 MED ORDER — MISOPROSTOL 25 MCG QUARTER TABLET
25.0000 ug | ORAL_TABLET | ORAL | Status: DC | PRN
  Administered 2020-03-24 (×2): 25 ug via VAGINAL
  Filled 2020-03-24 (×2): qty 1

## 2020-03-24 MED ORDER — PHENYLEPHRINE 40 MCG/ML (10ML) SYRINGE FOR IV PUSH (FOR BLOOD PRESSURE SUPPORT): 80.0000 ug | PREFILLED_SYRINGE | INTRAVENOUS | Status: DC | PRN

## 2020-03-24 MED ORDER — OXYTOCIN-SODIUM CHLORIDE 30-0.9 UT/500ML-% IV SOLN
1.0000 m[IU]/min | INTRAVENOUS | Status: DC
  Administered 2020-03-24: 2 m[IU]/min via INTRAVENOUS

## 2020-03-24 MED ORDER — SODIUM CHLORIDE 0.9% FLUSH
3.0000 mL | Freq: Two times a day (BID) | INTRAVENOUS | Status: DC
  Administered 2020-03-25 – 2020-03-26 (×2): 3 mL via INTRAVENOUS

## 2020-03-24 MED ORDER — DIPHENHYDRAMINE HCL 25 MG PO CAPS: 25.0000 mg | ORAL_CAPSULE | Freq: Four times a day (QID) | ORAL | Status: DC | PRN

## 2020-03-24 MED ORDER — OXYTOCIN BOLUS FROM INFUSION
333.0000 mL | Freq: Once | INTRAVENOUS | Status: AC
  Administered 2020-03-24: 333 mL via INTRAVENOUS

## 2020-03-24 MED ORDER — ACETAMINOPHEN 325 MG PO TABS: 650.0000 mg | ORAL_TABLET | ORAL | Status: DC | PRN

## 2020-03-24 MED ORDER — LABETALOL HCL 5 MG/ML IV SOLN: 20.0000 mg | INTRAVENOUS | Status: DC | PRN

## 2020-03-24 MED ORDER — SODIUM CHLORIDE 0.9 % IV SOLN: 250.0000 mL | INTRAVENOUS | Status: DC | PRN

## 2020-03-24 MED ORDER — ONDANSETRON HCL 4 MG/2ML IJ SOLN: 4.0000 mg | INTRAMUSCULAR | Status: DC | PRN

## 2020-03-24 MED ORDER — FENTANYL-BUPIVACAINE-NACL 0.5-0.125-0.9 MG/250ML-% EP SOLN: 12.0000 mL/h | EPIDURAL | Status: DC | PRN

## 2020-03-24 MED ORDER — MAGNESIUM SULFATE BOLUS VIA INFUSION
4.0000 g | Freq: Once | INTRAVENOUS | Status: AC
  Administered 2020-03-24: 4 g via INTRAVENOUS
  Filled 2020-03-24: qty 1000

## 2020-03-24 MED ORDER — MAGNESIUM SULFATE 40 GM/1000ML IV SOLN
2.0000 g/h | INTRAVENOUS | Status: AC
  Administered 2020-03-24 – 2020-03-25 (×2): 2 g/h via INTRAVENOUS
  Filled 2020-03-24 (×2): qty 1000

## 2020-03-24 MED ORDER — LIDOCAINE HCL (PF) 1 % IJ SOLN
30.0000 mL | INTRAMUSCULAR | Status: AC | PRN
  Administered 2020-03-24: 30 mL via SUBCUTANEOUS
  Filled 2020-03-24: qty 30

## 2020-03-24 MED ORDER — OXYTOCIN-SODIUM CHLORIDE 30-0.9 UT/500ML-% IV SOLN
1.0000 m[IU]/min | INTRAVENOUS | Status: DC
  Filled 2020-03-24: qty 500

## 2020-03-24 MED ORDER — TERBUTALINE SULFATE 1 MG/ML IJ SOLN: 0.2500 mg | Freq: Once | INTRAMUSCULAR | Status: DC | PRN

## 2020-03-24 MED ORDER — SOD CITRATE-CITRIC ACID 500-334 MG/5ML PO SOLN: 30.0000 mL | ORAL | Status: DC | PRN

## 2020-03-24 MED ORDER — IBUPROFEN 600 MG PO TABS
600.0000 mg | ORAL_TABLET | Freq: Four times a day (QID) | ORAL | Status: DC
  Administered 2020-03-24 – 2020-03-27 (×11): 600 mg via ORAL
  Filled 2020-03-24 (×11): qty 1

## 2020-03-24 MED ORDER — LACTATED RINGERS IV SOLN: 500.0000 mL | INTRAVENOUS | Status: DC | PRN

## 2020-03-24 MED ORDER — PRENATAL MULTIVITAMIN CH
1.0000 | ORAL_TABLET | Freq: Every day | ORAL | Status: DC
  Administered 2020-03-25 – 2020-03-26 (×2): 1 via ORAL
  Filled 2020-03-24 (×2): qty 1

## 2020-03-24 MED ORDER — ONDANSETRON HCL 4 MG PO TABS: 4.0000 mg | ORAL_TABLET | ORAL | Status: DC | PRN

## 2020-03-24 MED ORDER — BENZOCAINE-MENTHOL 20-0.5 % EX AERO
1.0000 "application " | INHALATION_SPRAY | CUTANEOUS | Status: DC | PRN
  Administered 2020-03-24: 1 via TOPICAL
  Filled 2020-03-24: qty 56

## 2020-03-24 MED ORDER — SODIUM CHLORIDE 0.9% FLUSH: 3.0000 mL | INTRAVENOUS | Status: DC | PRN

## 2020-03-24 MED ORDER — ONDANSETRON HCL 4 MG/2ML IJ SOLN
4.0000 mg | Freq: Four times a day (QID) | INTRAMUSCULAR | Status: DC | PRN
  Administered 2020-03-24: 4 mg via INTRAVENOUS
  Filled 2020-03-24: qty 2

## 2020-03-24 MED ORDER — NIFEDIPINE ER OSMOTIC RELEASE 30 MG PO TB24
30.0000 mg | ORAL_TABLET | Freq: Every day | ORAL | Status: DC
  Administered 2020-03-24 – 2020-03-25 (×2): 30 mg via ORAL
  Filled 2020-03-24 (×2): qty 1

## 2020-03-24 MED ORDER — SIMETHICONE 80 MG PO CHEW: 80.0000 mg | CHEWABLE_TABLET | ORAL | Status: DC | PRN

## 2020-03-24 MED ORDER — WITCH HAZEL-GLYCERIN EX PADS: 1.0000 "application " | MEDICATED_PAD | CUTANEOUS | Status: DC | PRN

## 2020-03-24 MED ORDER — DIBUCAINE (PERIANAL) 1 % EX OINT: 1.0000 "application " | TOPICAL_OINTMENT | CUTANEOUS | Status: DC | PRN

## 2020-03-24 NOTE — Lactation Note (Addendum)
This note was copied from a baby's chart. Lactation Consultation Note  Patient Name: Joanne Harper [id number] Date: 03/24/2020  Mom is a G1P1. baby Joanne [name] born at [redacted] weeks gestation, SGA, weighing less than 6 pounds now 6 hours old.  Mom has not breastfed yet or pumped.  Mom reports she wants to exclusively breastfed. Mom reports she has no breastpump for home use. Sent referral to [facility] office. However mom reports she has Medicaid but has not gotten on [facility] yet Mom and [name] doing STS.  Mom reports [name] just fed formula.  Mom reports they were waiting on lactation.   Urged mom to have RN call lactation as soon as [name] cues and if she doesn't cue that she needs to eat 8-12 or more times day. Let RN know as well.  No breastpump in room to initiate pumping so RN said she would find one and start mom pumping. However, RN not able to find a pump so LC got one from 4th floor .  When LC came back infant cuing slightly.  Showed mom how to do some hand expression and spoon feeding with drops of colostrum.  Infant gaggy and spitty but still cuing.  Infant took approximately 3 ml via spoon.Assisted mom with hand expression so she could have a supplement to feed her past breastfeeding at the next feed.   Urged mom to keep her sitting up about 30 minutes.   Urged mom to pump every 3 hours past breastfeeding and/or attempted breastfeed. Reviewed and left LPTI green sheet with parents.  Showed mom how to massage and hand express and mom able to hand express drops of colostrum easily.  Put drops in [name]'s mouth. She would stick her tongue out and take them.   Reviewed and left [facility] handout.  Urged parents to call lactation as needed.   Maternal Data    Feeding Feeding Type: Bottle Fed - Formula Nipple Type: Nfant Slow Flow (purple)  LATCH Score                   Interventions    Lactation Tools Discussed/Used     Consult  Status      Jahmal Dunavant Michaelle Copas 03/24/2020, 9:17 PM

## 2020-03-24 NOTE — H&P (Addendum)
Joanne Harper is a 24 y.o. female G1P0 [redacted]w[redacted]d presenting for IOL for preeclampsia. She reports no LOF, VB, Contractions. Normal FM. Reports on/off HA, none currently. Denies SOB/RUQ pain  Pregnancy c/b: 1. Obesity (BMI 52.6) 2. Anemia (Admission H/H 9.6/30.8) 3. FOB history of heart disease: fetal echo was normal  OB History    Gravida  1   Para      Term      Preterm      AB      Living        SAB      TAB      Ectopic      Multiple      Live Births             Past Medical History:  Diagnosis Date  . Medical history non-contributory   . Pregnancy induced hypertension   . Vaginal Pap smear, abnormal    Past Surgical History:  Procedure Laterality Date  . TONSILLECTOMY    . WISDOM TOOTH EXTRACTION     Family History: family history includes Anxiety disorder in her mother; Cancer in her mother; Depression in her mother; Diabetes in her father; Hypertension in her father and mother. Social History:  reports that she has never smoked. She has never used smokeless tobacco. She reports that she does not drink alcohol and does not use drugs.     Maternal Diabetes: No Genetic Screening: Normal Maternal Ultrasounds/Referrals: Last scan (8/31) EFW 2199g, 4lb14oz (13%) Fetal Ultrasounds or other Referrals:  None Maternal Substance Abuse:  No  Significant Maternal Medications:  None Significant Maternal Lab Results:  None Other Comments:  None  Review of Systems Per HPI Exam Physical Exam  Dilation: 1 Effacement (%): 60, 70 Station: -3 Exam by:: S Moyer RN Blood pressure (!) 135/105, pulse 87, temperature 97.8 F (36.6 C), temperature source Oral, resp. rate 18, height 5\' 5"  (1.651 m), weight (!) 147.2 kg.   Patient Vitals for the past 24 hrs:  BP Temp Temp src Pulse Resp Height Weight  03/24/20 0934 (!) 135/105 -- -- 87 18 -- --  03/24/20 0700 (!) 142/103 -- -- 87 16 -- --  03/24/20 0601 138/81 -- -- (!) 102 14 -- --  03/24/20 0542 (!) 140/92 -- -- 89  15 -- --  03/24/20 0503 (!) 163/103 -- -- 91 -- -- --  03/24/20 0501 (!) 157/110 97.8 F (36.6 C) Oral 86 13 -- --  03/24/20 0401 (!) 143/90 -- -- (!) 104 16 -- --  03/24/20 0300 (!) 143/79 -- -- 95 14 -- --  03/24/20 0200 140/87 -- -- 94 15 -- --  03/24/20 0104 (!) 151/92 -- -- 80 17 -- --  03/24/20 0049 -- -- -- -- -- 5\' 5"  (1.651 m) (!) 147.2 kg  03/24/20 0020 (!) 149/101 98.3 F (36.8 C) Oral (!) 103 16 -- --    NAD, resting comfortably Gravid abdomen Fetal testing: FHR 140, mod variability, no decels, contractions q 2-13min  Recent Labs    03/24/20 0032  WBC 9.7  HGB 9.6*  HCT 30.8*  PLT 354  NA 135  K 4.8  CL 106  BUN 15  CREATININE 0.90  AST 32  ALT 17  BILITOT 0.5  Urine P/C 0.35  Prenatal labs: ABO, Rh:  --/--/O POS (09/15 0032) Antibody: NEG (09/15 0032) Rubella: Immune (03/26 0000) RPR: Nonreactive (07/16 0000)  HBsAg: Negative (03/26 0000)  HIV: Non-reactive (03/26 0000)  GBS: Negative/-- (09/09 0000)  Assessment/Plan: 24 y.o. G1P0 @ [redacted]w[redacted]d here for IOL for preeclampsia 1. Labor: S/p cytotec x 2, now s/p SROM, plan for pitocin 2. Preeclampsia: normal admission labs except for elevated urine P/C; monitor for signs/symptoms of worsening preeclampsia    Taralynn Quiett K Taam-Akelman 03/24/2020, 10:24 AM

## 2020-03-24 NOTE — Progress Notes (Signed)
Called "[place]" on [employer] and left voice message.  Pt admit BP to MBU was 163/80, repeat was 147/88.  Called to notify MD.  Pt asymptomatic.  Only having soreness r/t perieum/1st degree w/repair.  Ibuprofen given.  Will continue to monitor.

## 2020-03-24 NOTE — Progress Notes (Signed)
OBGYN Note Vitals:   03/24/20 1600 03/24/20 1630 03/24/20 1758 03/24/20 1820  BP: (!) 158/87 (!) 160/91 (!) 163/80 (!) 147/88  Pulse: 76 72 80 81  Resp:   20   Temp:  98.2 F (36.8 C) 98.2 F (36.8 C)   TempSrc:  Oral Oral   SpO2:   100% 100%  Weight:      Height:       Joanne Harper 24 y.o. G1P1001 PPD#0 s/p SVD at [redacted]w[redacted]d, IOL for preeclampsia, now with severe features by BP. Asymptomatic.. -Start Mag (4g bolus, then 2gx24h) -Start Procardia 30xL QD. Ordered IV antihypertensives PRN -CBC/CMP now and ordered for AM Joanne Harper 03/24/20 6:43 PM

## 2020-03-25 LAB — COMPREHENSIVE METABOLIC PANEL
ALT: 15 U/L (ref 0–44)
AST: 17 U/L (ref 15–41)
Albumin: 2.4 g/dL — ABNORMAL LOW (ref 3.5–5.0)
Alkaline Phosphatase: 131 U/L — ABNORMAL HIGH (ref 38–126)
Anion gap: 7 (ref 5–15)
BUN: 10 mg/dL (ref 6–20)
CO2: 22 mmol/L (ref 22–32)
Calcium: 8.1 mg/dL — ABNORMAL LOW (ref 8.9–10.3)
Chloride: 107 mmol/L (ref 98–111)
Creatinine, Ser: 0.78 mg/dL (ref 0.44–1.00)
GFR calc Af Amer: >60 mL/min (ref >60)
GFR calc non Af Amer: >60 mL/min (ref >60)
Glucose, Bld: 84 mg/dL (ref 70–99)
Potassium: 4.3 mmol/L (ref 3.5–5.1)
Sodium: 136 mmol/L (ref 135–145)
Total Bilirubin: 0.4 mg/dL (ref 0.3–1.2)
Total Protein: 5.8 g/dL — ABNORMAL LOW (ref 6.5–8.1)

## 2020-03-25 LAB — CBC
HCT: 28.1 % — ABNORMAL LOW (ref 36.0–46.0)
Hemoglobin: 8.6 g/dL — ABNORMAL LOW (ref 12.0–15.0)
MCH: 21.7 pg — ABNORMAL LOW (ref 26.0–34.0)
MCHC: 30.6 g/dL (ref 30.0–36.0)
MCV: 71 fL — ABNORMAL LOW (ref 80.0–100.0)
Platelets: 280 10*3/uL (ref 150–400)
RBC: 3.96 MIL/uL (ref 3.87–5.11)
RDW: 16.5 % — ABNORMAL HIGH (ref 11.5–15.5)
WBC: 10.9 10*3/uL — ABNORMAL HIGH (ref 4.0–10.5)
nRBC: 0.2 % (ref 0.0–0.2)

## 2020-03-25 MED ORDER — NIFEDIPINE ER OSMOTIC RELEASE 30 MG PO TB24
60.0000 mg | ORAL_TABLET | Freq: Every day | ORAL | Status: DC
  Administered 2020-03-26 – 2020-03-27 (×2): 60 mg via ORAL
  Filled 2020-03-25 (×2): qty 2

## 2020-03-25 MED ORDER — FERROUS SULFATE 325 (65 FE) MG PO TABS
325.0000 mg | ORAL_TABLET | Freq: Every day | ORAL | Status: DC
  Administered 2020-03-25 – 2020-03-27 (×3): 325 mg via ORAL
  Filled 2020-03-25 (×3): qty 1

## 2020-03-25 NOTE — Lactation Note (Signed)
This note was copied from a baby's chart. Lactation Consultation Note  Patient Name: Joanne Harper [id number] Date: 03/25/2020 Reason for consult: Follow-up assessment;Early term 37-38.6wks;1st time breastfeeding;Primapara;Infant weight loss;Infant < 6lbs;Other (Comment) (SGA)  Visited with mom of a 33 hours old ETI female, baby is  SGA and < 6 lbs. Mom is a P1, and reported to [facility] that she's going to be pumping and bottle feeding exclusively, that's why there wasn't any LATCH score on flowsheets. She's already pumping every 3 hours and got about 2 ml of colostrum in her last pumping session, praised her for her efforts.   Baby was been spoon fed (breastmilk) and she's also been supplemented with Similac 22 calorie formula. Reviewed formula supplementation guidelines for LPIs due to baby's birth weight, LC explained to parents that amounts will go up every 24 hours until the 72 hours mark. Parents will monitor for spitting up since baby is already taking 20 ml within an hour; she's at 7% weight loss. Baby is on NFant purple nipple from NICU.  Reviewed lactogenesis II, pumping schedule, formula guidelines, benefits of STS care, feeding cues and size of baby's stomach.  Feeding plan:  1. Encouraged mom to continue pumping every 3 hours, ideally 8 pumping sessions in 24 hours 2. She'll feed baby her EBM first and complete volumes required for supplementation with Similac 22 calorie formula  Dad present during [facility] consultation, when LC asked if he had questions/concerns, he said he didn't because this is not his first child, but it's mom's first baby. Parents reported all questions and concerns were answered, they're both aware of LC OP services and will call PRN.    Maternal Data    Feeding    LATCH Score                   Interventions Interventions: Breast feeding basics reviewed;DEBP;Expressed milk  Lactation Tools Discussed/Used Tools: Pump Breast pump type:  Double-Electric Breast Pump   Consult Status Consult Status: Follow-up Date: 03/26/20 Follow-up type: In-patient    Shakora Nordquist Venetia Constable 03/25/2020, 9:01 AM

## 2020-03-25 NOTE — Progress Notes (Signed)
Vitals:   03/25/20 0412 03/25/20 0559 03/25/20 0744 03/25/20 1137  BP:   (!) 142/90 (!) 151/96  Pulse:   84 (!) 101  Resp: 19 18 18 18   Temp:   98.1 F (36.7 C) 98.4 F (36.9 C)  TempSrc:   Oral Oral  SpO2:    99%  Weight:      Height:       Results for orders placed or performed during the hospital encounter of 03/24/20 (from the past 24 hour(s))  CBC     Status: Abnormal   Collection Time: 03/24/20  2:56 PM  Result Value Ref Range   WBC 10.6 (H) 4.0 - 10.5 K/uL   RBC 4.64 3.87 - 5.11 MIL/uL   Hemoglobin 10.1 (L) 12.0 - 15.0 g/dL   HCT 03/26/20 (L) 36 - 46 %   MCV 70.9 (L) 80.0 - 100.0 fL   MCH 21.8 (L) 26.0 - 34.0 pg   MCHC 30.7 30.0 - 36.0 g/dL   RDW 24.2 (H) 35.3 - 61.4 %   Platelets 300 150 - 400 K/uL   nRBC 0.0 0.0 - 0.2 %  Comprehensive metabolic panel     Status: Abnormal   Collection Time: 03/24/20  6:53 PM  Result Value Ref Range   Sodium 130 (L) 135 - 145 mmol/L   Potassium 4.4 3.5 - 5.1 mmol/L   Chloride 102 98 - 111 mmol/L   CO2 20 (L) 22 - 32 mmol/L   Glucose, Bld 126 (H) 70 - 99 mg/dL   BUN 10 6 - 20 mg/dL   Creatinine, Ser 03/26/20 0.44 - 1.00 mg/dL   Calcium 8.5 (L) 8.9 - 10.3 mg/dL   Total Protein 5.8 (L) 6.5 - 8.1 g/dL   Albumin 2.4 (L) 3.5 - 5.0 g/dL   AST 20 15 - 41 U/L   ALT 17 0 - 44 U/L   Alkaline Phosphatase 142 (H) 38 - 126 U/L   Total Bilirubin 0.5 0.3 - 1.2 mg/dL   GFR calc non Af Amer >60 >60 mL/min   GFR calc Af Amer >60 >60 mL/min   Anion gap 8 5 - 15  CBC     Status: Abnormal   Collection Time: 03/24/20  6:53 PM  Result Value Ref Range   WBC 14.3 (H) 4.0 - 10.5 K/uL   RBC 4.33 3.87 - 5.11 MIL/uL   Hemoglobin 9.4 (L) 12.0 - 15.0 g/dL   HCT 03/26/20 (L) 36 - 46 %   MCV 71.1 (L) 80.0 - 100.0 fL   MCH 21.7 (L) 26.0 - 34.0 pg   MCHC 30.5 30.0 - 36.0 g/dL   RDW 08.6 (H) 76.1 - 95.0 %   Platelets 286 150 - 400 K/uL   nRBC 0.0 0.0 - 0.2 %  CBC     Status: Abnormal   Collection Time: 03/25/20  7:45 AM  Result Value Ref Range   WBC 10.9 (H) 4.0  - 10.5 K/uL   RBC 3.96 3.87 - 5.11 MIL/uL   Hemoglobin 8.6 (L) 12.0 - 15.0 g/dL   HCT 03/27/20 (L) 36 - 46 %   MCV 71.0 (L) 80.0 - 100.0 fL   MCH 21.7 (L) 26.0 - 34.0 pg   MCHC 30.6 30.0 - 36.0 g/dL   RDW 67.1 (H) 24.5 - 80.9 %   Platelets 280 150 - 400 K/uL   nRBC 0.2 0.0 - 0.2 %  Comprehensive metabolic panel     Status: Abnormal   Collection Time: 03/25/20  7:45  AM  Result Value Ref Range   Sodium 136 135 - 145 mmol/L   Potassium 4.3 3.5 - 5.1 mmol/L   Chloride 107 98 - 111 mmol/L   CO2 22 22 - 32 mmol/L   Glucose, Bld 84 70 - 99 mg/dL   BUN 10 6 - 20 mg/dL   Creatinine, Ser 3.00 0.44 - 1.00 mg/dL   Calcium 8.1 (L) 8.9 - 10.3 mg/dL   Total Protein 5.8 (L) 6.5 - 8.1 g/dL   Albumin 2.4 (L) 3.5 - 5.0 g/dL   AST 17 15 - 41 U/L   ALT 15 0 - 44 U/L   Alkaline Phosphatase 131 (H) 38 - 126 U/L   Total Bilirubin 0.4 0.3 - 1.2 mg/dL   GFR calc non Af Amer >60 >60 mL/min   GFR calc Af Amer >60 >60 mL/min   Anion gap 7 5 - 15    BPs creeping up again.  Will increase to Procardia to 60 mg XL.

## 2020-03-25 NOTE — Progress Notes (Signed)
Patient is eating, ambulating, voiding.  Pain control is good.  Vitals:   03/25/20 0300 03/25/20 0401 03/25/20 0412 03/25/20 0559  BP: 135/73 (!) 147/90    Pulse: 83 82    Resp: 16  19 18   Temp:      TempSrc:      SpO2:      Weight:      Height:        Fundus firm Perineum without swelling.  Lab Results  Component Value Date   WBC 14.3 (H) 03/24/2020   HGB 9.4 (L) 03/24/2020   HCT 30.8 (L) 03/24/2020   MCV 71.1 (L) 03/24/2020   PLT 286 03/24/2020    --/--/O POS (09/15 0032)/RI  A/P Post partum day 1, severe preeclampsia. On magnesium sulfate- 24 hours up at 8 pm tonight. Procardia for hypertension-BPs stable Otherwise routine care.  Expect d/c one to two days.   Iron for anemia.  12-09-2005

## 2020-03-25 NOTE — Progress Notes (Signed)
Vitals:   03/25/20 1137 03/25/20 1517 03/25/20 1638 03/25/20 1955  BP: (!) 151/96 139/78 (!) 144/73 137/75  Pulse: (!) 101 (!) 108 100 96  Resp: 18 18 18 17   Temp: 98.4 F (36.9 C) 98.4 F (36.9 C) 98.2 F (36.8 C) 97.9 F (36.6 C)  TempSrc: Oral Oral Oral Oral  SpO2: 99%  98% 99%  Weight:      Height:        Procardia  60 to begin in am.

## 2020-03-26 NOTE — Progress Notes (Addendum)
Post Partum Day 60 24 year old G1 P0 admitted for induction of labor for gestational hypertension Subjective: Feeling well after magnesium discontinued.  Denies headache, vision changes, right upper quadrant or epigastric pain.  Ambulating without difficulty.  Voiding without difficulty.  No nausea or vomiting with p.o. intake  Objective: Blood pressure (!) 148/87, pulse 89, temperature 98.2 F (36.8 C), temperature source Oral, resp. rate 17, height 5\' 5"  (1.651 m), weight (!) 147.2 kg, SpO2 100 %, unknown if currently breastfeeding.  Today's Vitals   03/26/20 0030 03/26/20 0435 03/26/20 0720 03/26/20 0750  BP: 140/76 (!) 141/84 (!) 148/87   Pulse: 99 94 89   Resp: 16 16 17    Temp: 98.2 F (36.8 C) 98.5 F (36.9 C) 98.2 F (36.8 C)   TempSrc: Oral Oral Oral   SpO2: 99% 99% 100%   Weight:      Height:      PainSc:  0-No pain  0-No pain   Body mass index is 54.02 kg/m. Total I/O In: -  Out: 400 [Urine:400]   Physical Exam:  General: alert, cooperative and appears stated age Lochia: appropriate Uterine Fundus: firm Incision: healing well DVT Evaluation: No evidence DVT  Recent Labs    03/24/20 1853 03/25/20 0745  HGB 9.4* 8.6*  HCT 30.8* 28.1*    Assessment/Plan: 24 year old G1 P0 admitted for induction of labor for mild pre-eclampsia.  Developed severe features with severe range preeclampsia postpartum.  1) preeclampsia: Status post 24 hours magnesium sulfate.  Currently on Procardia 60 mg XL for hypertension control.  Labs have remained normal since admission. 2) hypertension: Blood pressures now back in the mild range at 140s/70s to 80s on 60 mg Procardia XL.  Procardia increased to 60 mg yesterday afternoon.  Will monitor for an additional 24 hours in-house to confirm adequate blood pressure control on current medication regimen 3) anemia of pregnancy: Patient's hemoglobin on admission was 9.4, now 8.6.  Patient is without symptoms.  Will continue oral iron 4)  anticipate discharge home tomorrow if blood pressures remain stable    LOS: 2 days   03/27/20 03/26/2020, 11:05 AM

## 2020-03-26 NOTE — Lactation Note (Signed)
This note was copied from a baby's chart. Lactation Consultation Note  Patient Name: Joanne Harper [id number] Date: 03/26/2020 Reason for consult: Follow-up assessment;Primapara;1st time breastfeeding;Infant < 6lbs;Early term 37-38.6wks  LC in to visit with P1 Mom of 37 wk baby at 63 hrs old.  Baby is going to breast and latching for 15-30 mins over night, followed with 22 cal. formula supplementation of 6-20 ml by slow flow bottle.  Pump set up at bedside.  Mom states she has pumped, but usually isn't pumping if baby latches to the breast.  Encouraged Mom to pump after each feeding as baby is small in size and she needs to stimulate and support a full milk supply.    Set up washing and drying bin and educated Mom on importance of washing, rinsing and air drying parts in separate bin.    Mom has not heard from [facility].  LC refaxed [facility] referral as Mom does not have a DEBP at home.    Encouraged Mom to ask for help prn.  Interventions Interventions: Breast feeding basics reviewed;Skin to skin;Breast massage;Hand express;DEBP  Lactation Tools Discussed/Used Tools: Pump;Bottle Breast pump type: Double-Electric Breast Pump   Consult Status Consult Status: Follow-up Date: 03/27/20 Follow-up type: In-patient    Judee Clara 03/26/2020, 9:11 AM

## 2020-03-26 NOTE — Lactation Note (Signed)
This note was copied from a baby's chart. Lactation Consultation Note Baby 32 hrs old at time of consult. Baby sleeping in bassinet. Spoke w/mom about the feedings. BF and supplementing as well as increasing supplemental amount. Gave mom LPI information sheet and supplementing sheet w/breast/formula feeding.   Baby hasn't been to breast since delivery. Explained to mom if baby wasn't going to the breast then the baby needed more formula/BM and can't go by supplementing amount. Mom stated understanding. Mom stated the baby hasn't been interested in BF.  LC changed baby's wet diaper, un-swaddled and placed baby to breast in football position.  Mom has Large soft breast w/semi flat nipples. Very compressible. LC placed rolled cloth under breast for added support. Hand expression demonstrated w/colostrum noted. Praised mom. Mom stated she has been giving a little colostrum in spoon then formula feeding baby w/bottle and purple nipple. Encouraged breast massage at intervals during feeding.  Discussed positioning, support, props, safety while feeding. Baby latched shallow at first. Re-latched baby. Baby has small mouth, mom has just right size nipple for the baby. The baby BF great. Mom excited.  Placed light blanket over baby d/t low temps earlier. Put hat on head and mittens. Discussed importance of keeping hat on which is difficult d/t small head.  After BF mom is to give colostrum/BM first then give 22 cal. Similac. Mom in agreement.  Baby BF great 15 min. Then spoon fed 2 ml colostrum, then gave 20 ml Similac formula. Discussed paced feeding, demonstrated how to hold baby upright and bottle placement. Noting baby taking breaks.   Mom has DEBP at bedside. Encouraged mom to pump every 3 hrs. Mom stated she has been pumping every 4 hrs because she has been so tired.  Lots of teaching done during consult. Encouraged mom to call for assistance  Or questions. Discussed importance of STS and  strict I&O for baby. Milk storage and pumping discussed.  Praised mom for all her hard work.   Patient Name: Joanne Harper IRCVE'L Date: 03/26/2020 Reason for consult: Follow-up assessment;Primapara;Early term 37-38.6wks;Infant < 6lbs;Infant weight loss   Maternal Data Has patient been taught Hand Expression?: Yes Does the patient have breastfeeding experience prior to this delivery?: No  Feeding Feeding Type: Breast Milk with Formula added Nipple Type: Nfant Slow Flow (purple)  LATCH Score Latch: Grasps breast easily, tongue down, lips flanged, rhythmical sucking.  Audible Swallowing: None  Type of Nipple: Flat  Comfort (Breast/Nipple): Soft / non-tender  Hold (Positioning): Assistance needed to correctly position infant at breast and maintain latch.  LATCH Score: 6  Interventions Interventions: Breast feeding basics reviewed;Assisted with latch;Breast compression;Skin to skin;Adjust position;Shells;Breast massage;Support pillows;DEBP;Position options;Hand express;Pre-pump if needed;Expressed milk  Lactation Tools Discussed/Used     Consult Status Consult Status: Follow-up Date: 03/26/20 Follow-up type: In-patient    Charyl Dancer 03/26/2020, 12:36 AM

## 2020-03-27 MED ORDER — NIFEDIPINE ER OSMOTIC RELEASE 60 MG PO TB24: 60.0000 mg | ORAL_TABLET | Freq: Every day | ORAL | 1 refills | Status: AC

## 2020-03-27 MED ORDER — IBUPROFEN 600 MG PO TABS: 600.0000 mg | ORAL_TABLET | Freq: Four times a day (QID) | ORAL | 0 refills | Status: AC | PRN

## 2020-03-27 NOTE — Lactation Note (Signed)
This note was copied from a baby's chart. Lactation Consultation Note  Infant is 37 weeks 70 hours old with 2% weight loss. Mom states infant has been doing well at the breast. She denies having any nipple pain. Mom has been supplementing with formula taking between 12-22 ml of Similac Neosure 22 calories. Infant has adequate output.   Mom does not have a DEBP at home. It is plan to EBF and her pump will not arrive until Tuesday. LC processed a WIC pump loaner for Mom. LC reviewed the cost of the pump if not returned as well as the return date on 9/30 on the first floor. LC gave Mom her receipt and she confirmed understanding of the loaner agreement.   Plan 1. Mom will continue to feed baby based on cues 8-12 in 24 hour period.          2. Mom to offer supplementation following the LPTI guidelines which LC reviewed with Mom and gave her the brochure.            3. Mom will continue to pump q 3 hrs for 15 minutes           4. Mom states she has enough breastmilk to supplement feedings with and received the okay to d/c formula with provider.            5. Mom's f/u appointment on Monday            6. Engorgement signs, symptoms, prevention and treatment reviewed with Mom             7. LC brochure and outpatient services reviewed with Mom as well.

## 2020-03-27 NOTE — Discharge Summary (Signed)
Postpartum Discharge Summary       Patient Name: Joanne Harper DOB: 01-08-1996 MRN: [id number]  Date of admission: 03/24/2020 Delivery date:03/24/2020  Delivering provider: Lyda Kalata K  Date of discharge: 03/27/2020  Admitting diagnosis: Gestational hypertension [O13.9] Intrauterine pregnancy: [redacted]w[redacted]d    Secondary diagnosis:  Active Problems:   Gestational hypertension   Preeclampsia   SVD (spontaneous vaginal delivery)    Discharge diagnosis: Term Pregnancy Delivered, Preeclampsia (severe) and Anemia                                              Post partum procedures:Magnesium sulfate, antihypertensive administration Augmentation: AROM, Pitocin and Cytotec Complications: None  Hospital course: Induction of Labor With Vaginal Delivery   24y.o. yo G1P1001 at 34w0das admitted to the hospital 03/24/2020 for induction of labor.  Indication for induction: Gestational hypertension.  Patient had an uncomplicated labor course as follows: Membrane Rupture Time/Date: 9:04 AM ,03/24/2020   Delivery Method:Vaginal, Spontaneous  Episiotomy: None  Lacerations:  1st degree  Details of delivery can be found in separate delivery note.  Patient had a routine postpartum course. Patient is discharged home 03/27/20.  Newborn Data: Birth date:03/24/2020  Birth time:3:06 PM  Gender:Female  Living status:Living  Apgars:9 ,9  [phone]   Magnesium Sulfate received: Yes: Seizure prophylaxis BMZ received: No Rhophylac:No MMR:N/A T-DaP:Given prenatally Flu: No Transfusion:No  Physical exam  Vitals:   03/26/20 1535 03/26/20 1922 03/26/20 2318 03/27/20 0410  BP:  (!) 142/77 (!) 122/55 130/71  Pulse:  89 (!) 107 87  Resp: _0 Temp: 97.8 F (36.6 C) 98 F (36.7 C) 98.8 F (37.1 C) 97.9 F (36.6 C)  TempSrc: Oral Oral Oral Oral  SpO2: 100% 100% 100% 99%  Weight:      Height:       General: alert, cooperative and no distress Lochia: appropriate Uterine  Fundus: firm DVT Evaluation: No evidence of DVT seen on physical exam. Labs: Lab Results  Component Value Date   WBC 10.9 (H) 03/25/2020   HGB 8.6 (L) 03/25/2020   HCT 28.1 (L) 03/25/2020   MCV 71.0 (L) 03/25/2020   PLT 280 03/25/2020   CMP Latest Ref Rng & Units 03/25/2020  Glucose 70 - 99 mg/dL 84  BUN 6 - 20 mg/dL 10  Creatinine 0.44 - 1.00 mg/dL 0.78  Sodium 135 - 145 mmol/L 136  Potassium 3.5 - 5.1 mmol/L 4.3  Chloride 98 - 111 mmol/L 107  CO2 22 - 32 mmol/L 22  Calcium 8.9 - 10.3 mg/dL 8.1(L)  Total Protein 6.5 - 8.1 g/dL 5.8(L)  Total Bilirubin 0.3 - 1.2 mg/dL 0.4  Alkaline Phos 38 - 126 U/L 131(H)  AST 15 - 41 U/L 17  ALT 0 - 44 U/L 15   Edinburgh Score: Edinburgh Postnatal Depression Scale Screening Tool 03/25/2020  I have been able to laugh and see the funny side of things. 0  I have looked forward with enjoyment to things. 0  I have blamed myself unnecessarily when things went wrong. 0  I have been anxious or worried for no good reason. 0  I have felt scared or panicky for no good reason. 0  Things have been getting on top of me. 0  I have been so unhappy that I have had difficulty sleeping. 0  I have  felt sad or miserable. 0  I have been so unhappy that I have been crying. 0  The thought of harming myself has occurred to me. 0  Edinburgh Postnatal Depression Scale Total 0      After visit meds:  Allergies as of 03/27/2020      Reactions   Penicillins Hives      Medication List    STOP taking these medications   aspirin EC 81 MG tablet   BIOTIN PO   ferrous sulfate 325 (65 FE) MG tablet   multivitamin-prenatal 27-0.8 MG Tabs tablet     TAKE these medications   ibuprofen 600 MG tablet Commonly known as: ADVIL Take 1 tablet (600 mg total) by mouth every 6 (six) hours as needed.   NIFEdipine 60 MG 24 hr tablet Commonly known as: Procardia XL Take 1 tablet (60 mg total) by mouth daily.        Discharge home in stable condition Infant  Feeding: Bottle and Breast Infant Disposition:home with mother Discharge instruction: per After Visit Summary and Postpartum booklet. Activity: Advance as tolerated. Pelvic rest for 6 weeks.  Diet: routine diet Anticipated Birth Control: Unsure Postpartum Appointment:1 week Future Appointments: Future Appointments  Date Time Provider [place]  03/30/2020  2:45 PM [facility] NURSE [facility]  03/30/2020  3:00 PM [facility] US1 [facility] [place]   Follow up Visit:  Follow-up Information    Taam-Akelman, Lawrence Santiago, MD Follow up in 1 week(s).   Specialty: Obstetrics and Gynecology Why: For a blood pressure check Contact information: [address] [place] [address] [phone]                   03/27/2020 Vanessa Kick, MD

## 2020-03-29 LAB — SURGICAL PATHOLOGY

## 2020-03-30 ENCOUNTER — Ambulatory Visit: Payer: Medicaid Other

## 2020-05-23 ENCOUNTER — Encounter (INDEPENDENT_AMBULATORY_CARE_PROVIDER_SITE_OTHER): Payer: Self-pay

## 2020-07-26 ENCOUNTER — Encounter (INDEPENDENT_AMBULATORY_CARE_PROVIDER_SITE_OTHER): Payer: Self-pay

## 2020-07-27 ENCOUNTER — Ambulatory Visit (INDEPENDENT_AMBULATORY_CARE_PROVIDER_SITE_OTHER): Payer: Self-pay | Admitting: Family Medicine

## 2020-08-10 ENCOUNTER — Ambulatory Visit (INDEPENDENT_AMBULATORY_CARE_PROVIDER_SITE_OTHER): Payer: Self-pay | Admitting: Family Medicine

## 2020-12-29 IMAGING — US US MFM OB DETAIL+14 WK
1 series · 13 of 28 positions shown · non-contrast
Comparison: none

[Series 1: us mfm ob detail+14 wk · 79 acquisitions, 13 frames shown]
[im 3/79]
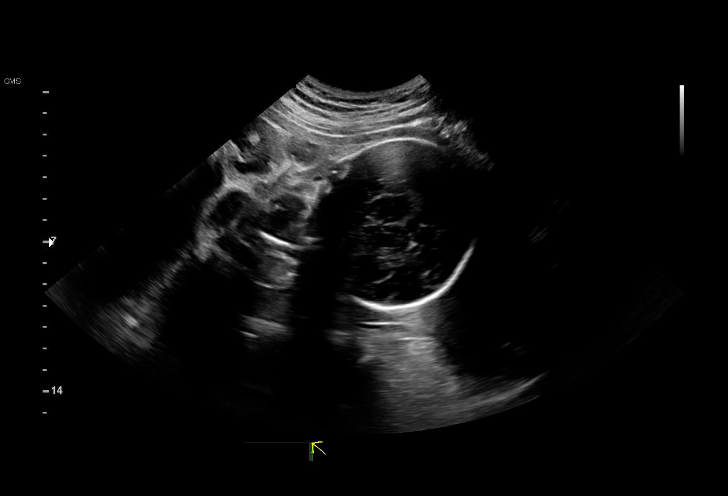
[im 9/79]
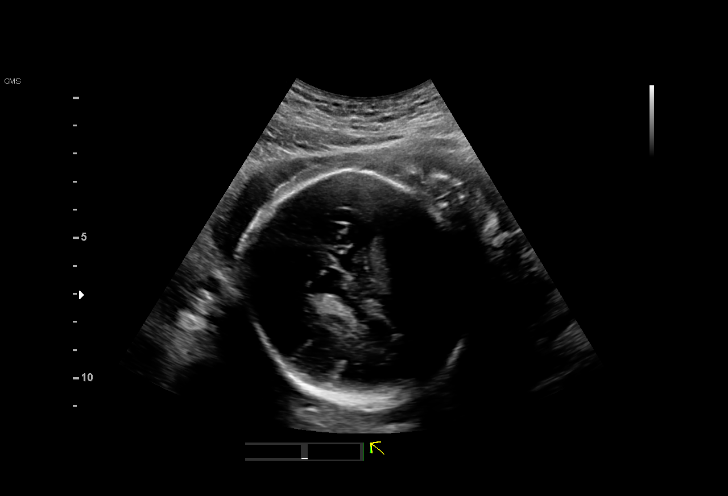
[im 15/79]
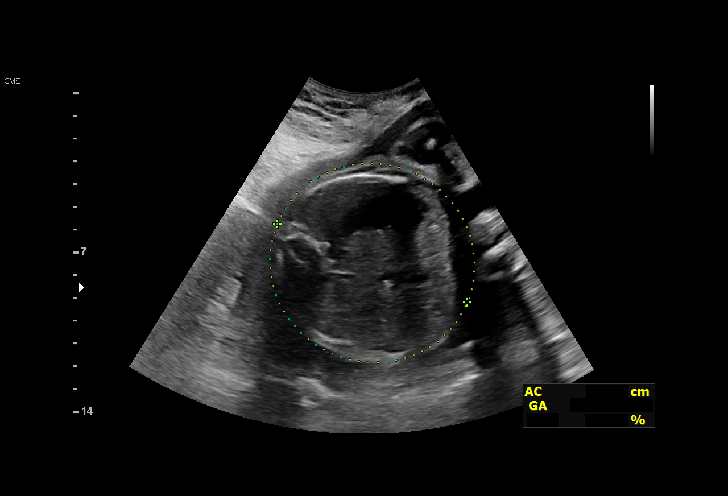
[im 21/79]
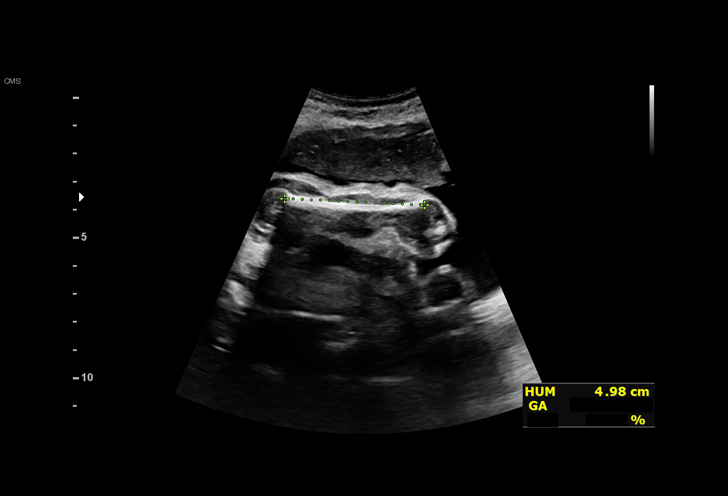
[im 27/79]
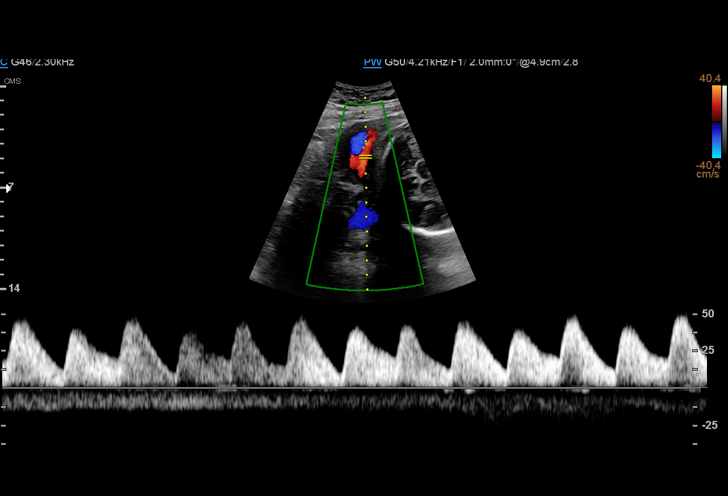
[im 32/79]
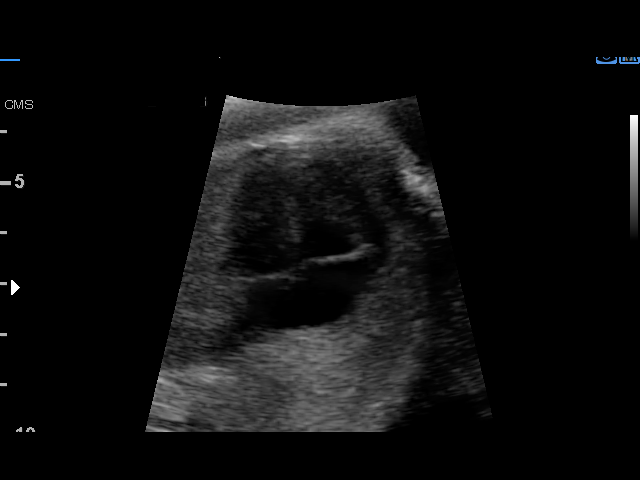
[im 41/79]
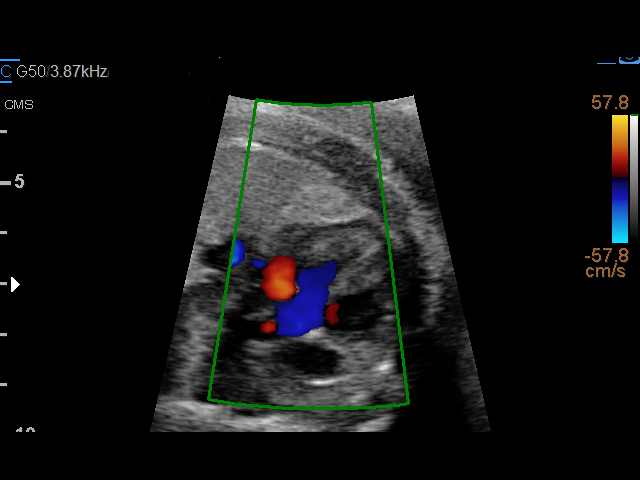
[im 47/79]
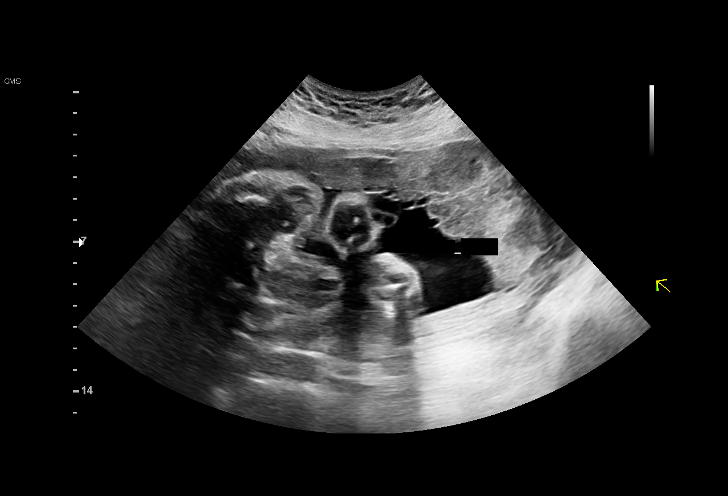
[im 53/79]
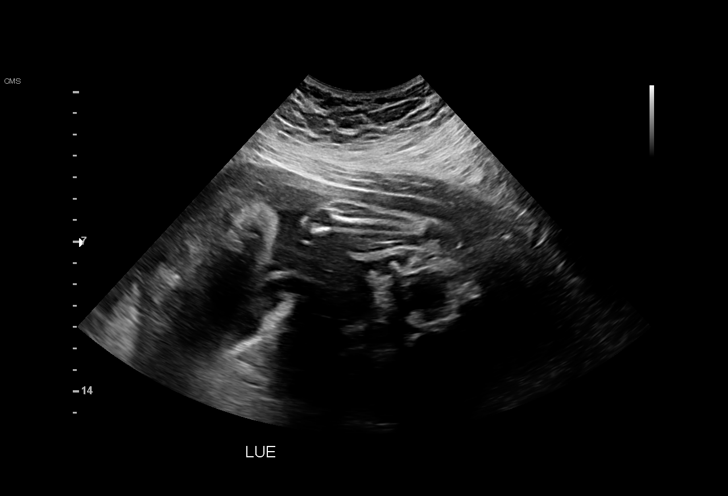
[im 58/79]
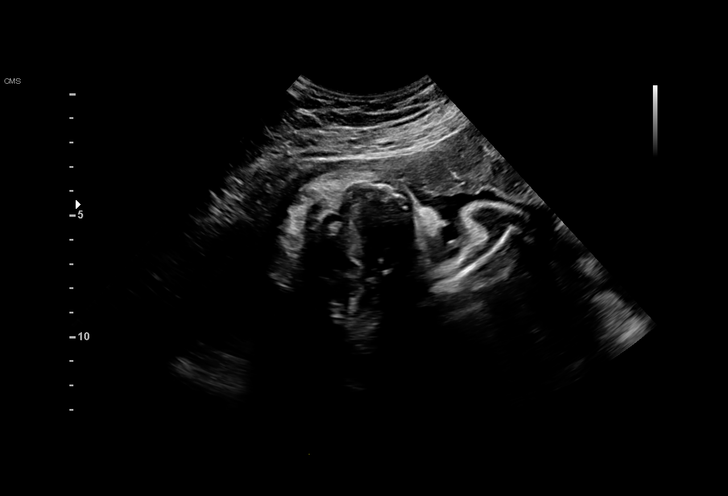
[im 64/79]
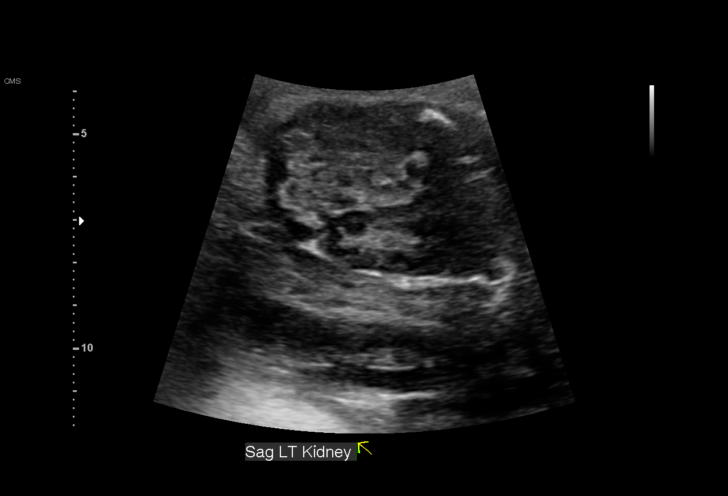
[im 70/79]
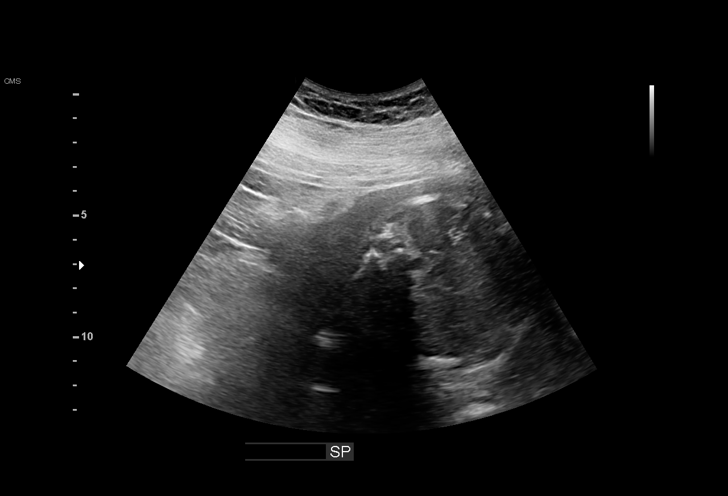
[im 76/79]
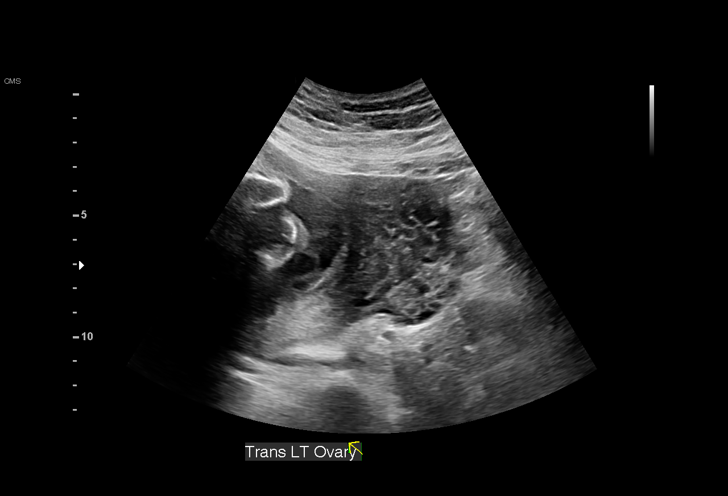

[13 of 28 positions shown; findings below may reference images not displayed]

Road; [HOSPITAL]

 1  US MFM OB DETAIL +14 WK               76811.01    OLYMPIA [facility]DA[facility]
 2  US MFM UA CORD DOPPLER                76820.02    OLYMPIA [facility]DAVERDE

Indications

 Encounter for fetal growth retardation
 Obesity complicating pregnancy, third
 trimester
 31 weeks gestation of pregnancy
Fetal Evaluation

 Num Of Fetuses:         1
 Cardiac Activity:       Observed
 Presentation:           Cephalic
 Placenta:               Left Fundal
 P. Cord Insertion:      Visualized, central

 AFI Sum(cm)     %Tile       Largest Pocket(cm)


 RUQ(cm)       RLQ(cm)       LUQ(cm)        LLQ(cm)

Biometry

 BPD:      76.9  mm     G. Age:  30w 6d         14  %    CI:        77.06   %    70 - 86
                                                         FL/HC:      20.9   %    19.1 -
 HC:      277.4  mm     G. Age:  30w 2d        1.3  %    HC/AC:      1.01        0.96 -
 AC:       274   mm     G. Age:  31w 3d         37  %    FL/BPD:     75.4   %    71 - 87
 FL:         58  mm     G. Age:  30w 2d          7  %    FL/AC:      21.2   %    20 - 24
 HUM:        50  mm     G. Age:  29w 2d        < 5  %
 CER:      40.4  mm     G. Age:  32w 4d         46  %
 LV:        5.2  mm

 Est. FW:    5625  gm    3 lb 11 oz      15  %
OB History

 Gravidity:    1
Gestational Age

 Clinical EDD:  31w 6d                                        EDD:   04/14/20
 U/S Today:     30w 5d                                        EDD:   04/22/20
 Best:          31w 6d     Det. By:  Clinical EDD             EDD:   04/14/20
Anatomy

 Cranium:               Appears normal         Aortic Arch:            Appears normal
 Cavum:                 Appears normal         Ductal Arch:            Appears normal
 Ventricles:            Appears normal         Diaphragm:              Appears normal
 Choroid Plexus:        Appears normal         Stomach:                Appears normal, left
                                                                       sided
 Cerebellum:            Appears normal         Abdomen:                Appears normal
 Posterior Fossa:       Appears normal         Abdominal Wall:         Appears nml (cord
                                                                       insert, abd wall)
 Nuchal Fold:           Not applicable (>20    Cord Vessels:           Appears normal (3
                        wks GA)                                        vessel cord)
 Face:                  Appears normal         Kidneys:                Appear normal
                        (orbits and profile)
 Lips:                  Appears normal         Bladder:                Appears normal
 Thoracic:              Appears normal         Spine:                  Appears normal
 Heart:                 Appears normal         Upper Extremities:      Appears normal
                        (4CH, axis, and
                        situs)
 RVOT:                  Appears normal         Lower Extremities:      Appears normal
 LVOT:                  Appears normal

 Other:  Nasal bone visualized. Open hands visualized. 3VV and 3VTV
         visualized. Hands and feet not well visualized due to advanced GA.
Doppler - Fetal Vessels

 Umbilical Artery
  S/D     %tile      RI    %tile
  2.68       50    0.63       57

Cervix Uterus Adnexa

 Cervix
 Not visualized (advanced GA >72wks)

 Right Ovary
 Within normal limits.

 Left Ovary
 Within normal limits.
Impression

 opinion ultrasound.  On your office ultrasound performed
 yesterday, the estimated fetal weight was at the 10th
 percentile.  Patient was informed that her placenta looked
 "old."
 Her prenatal course has been, otherwise, uneventful.
 On cell-free fetal DNA screening, the risks of fetal
 aneuploidies are not increased .MSAFP screening showed
 low risk for open-neural tube defects .  Patient does not have
 gestational diabetes.  She reports no chronic medical
 conditions.  Blood pressure today at our office is 133/85
 mmHg.

 On today's ultrasound, amniotic fluid is normal and good fetal
 activity seen.  The estimated fetal weight is at the 15th
 percentile.  Head circumference measurement is at between -
 2 and -1 SD (no evidence of microcephaly).  Abdominal
 circumference measurement is at the 37th percentile.
 Umbilical artery Doppler showed normal forward diastolic
 flow.  Placenta looks mature with some evidence of
 calcification.

 I have reassured the patient of findingsthat are not consistent
 with fetal growth restriction.  However ultrasound has
 limitations in accurately estimating fetal weights.  If fetal
 growth restriction is seen in follow-up scans, blood pressure
 needs to be monitored closely.  Gestational
 hypertension/preeclampsia is more common in first
 pregnancies and may first present as fetal growth restriction.
 Placental architecture does not clearly correlate with fetal
 growth restriction.
Recommendations

 An appointment was made for her to return in 3 weeks for
 fetal growth assessment.
                 Jumper, Giorgi

## 2021-01-19 IMAGING — US US MFM OB FOLLOW-UP
1 series · 14 of 28 positions shown · non-contrast
Comparison: none

[Series 1: us mfm ob follow-up · 43 acquisitions, 14 frames shown]
[im 2/43]
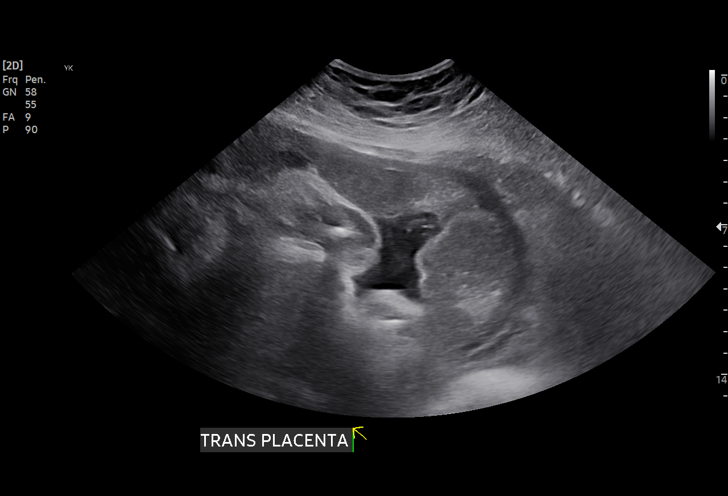
[im 5/43]
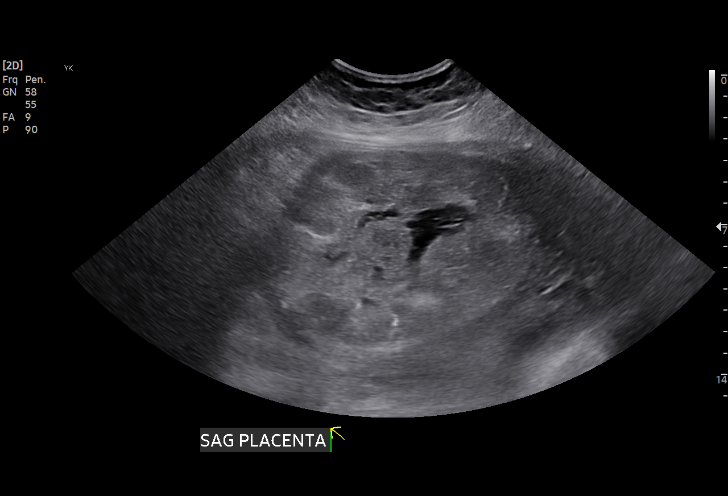
[im 8/43]
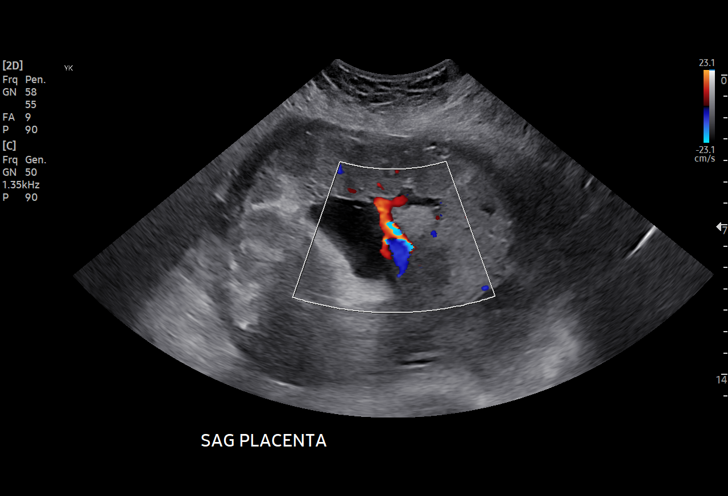
[im 11/43]
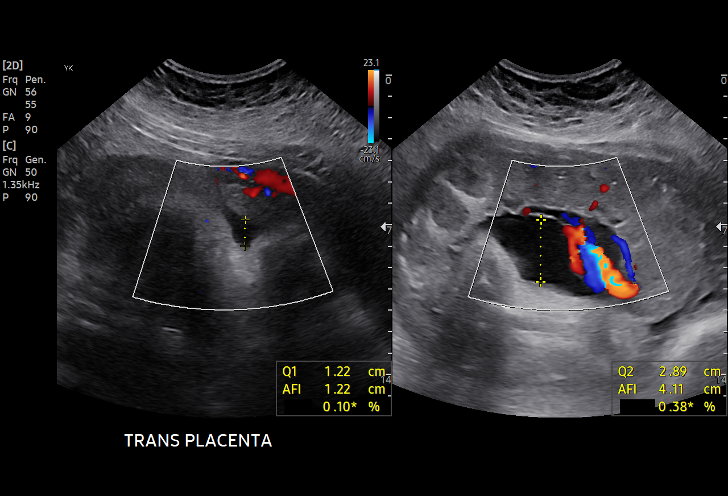
[im 15/43]
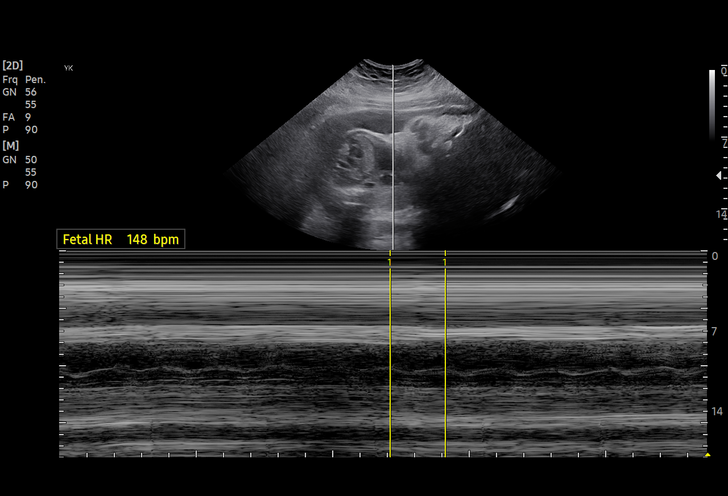
[im 18/43]
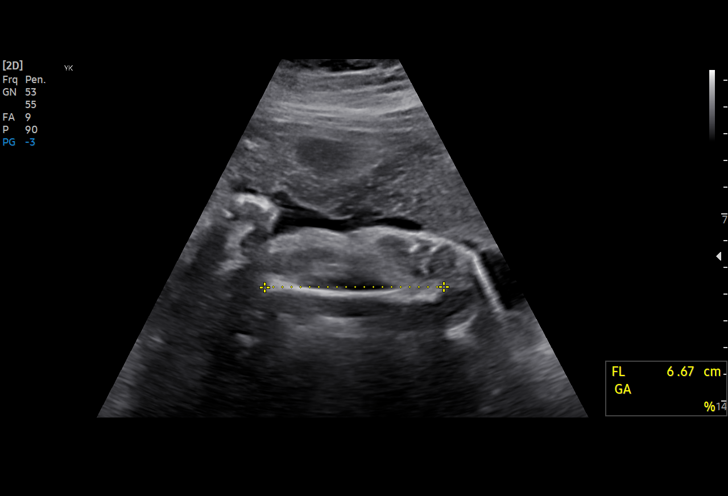
[im 21/43]
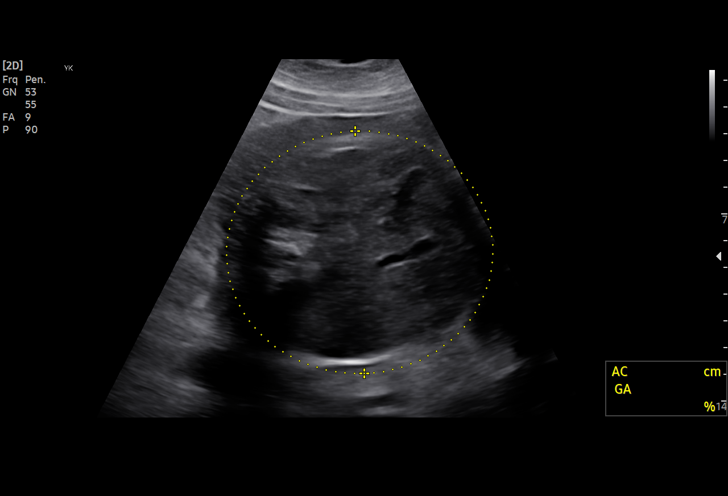
[im 24/43]
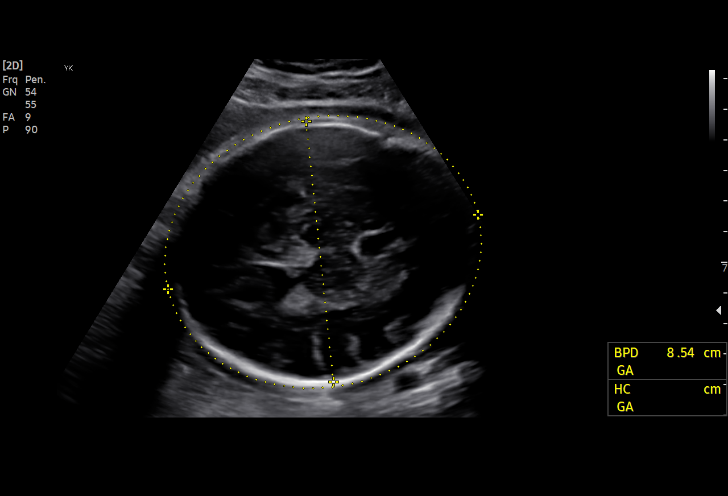
[im 27/43]
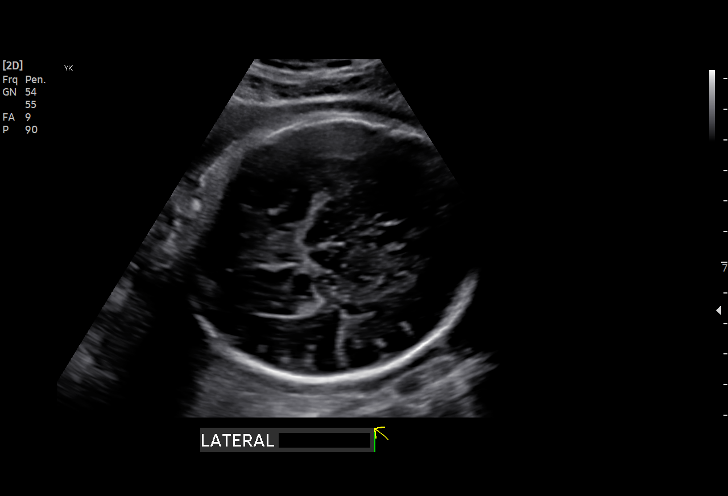
[im 30/43]
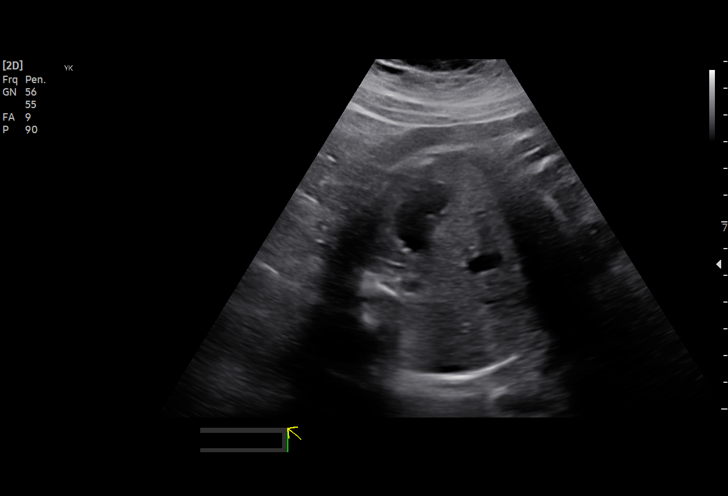
[im 33/43]
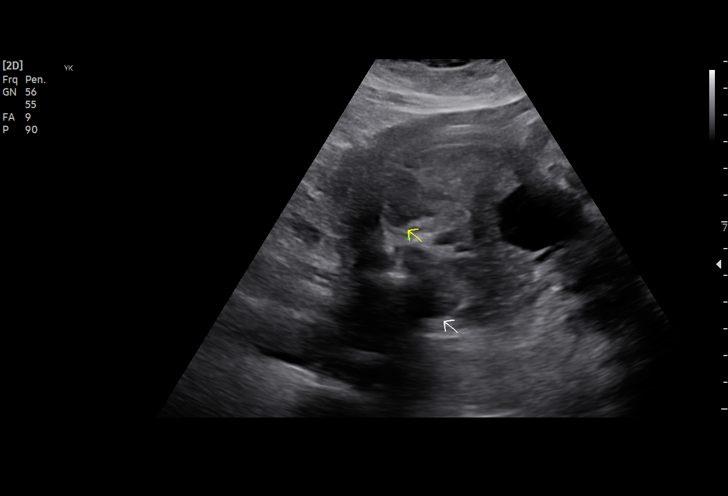
[im 36/43]
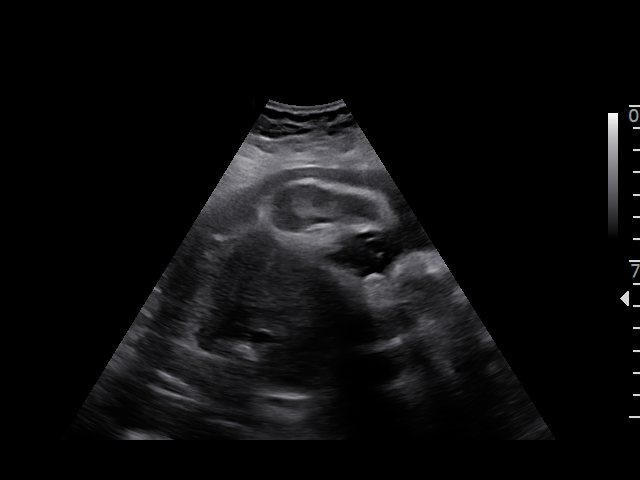
[im 39/43]
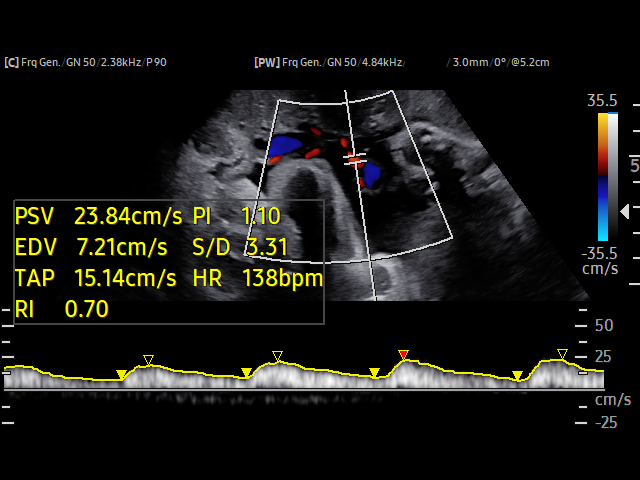
[im 43/43]
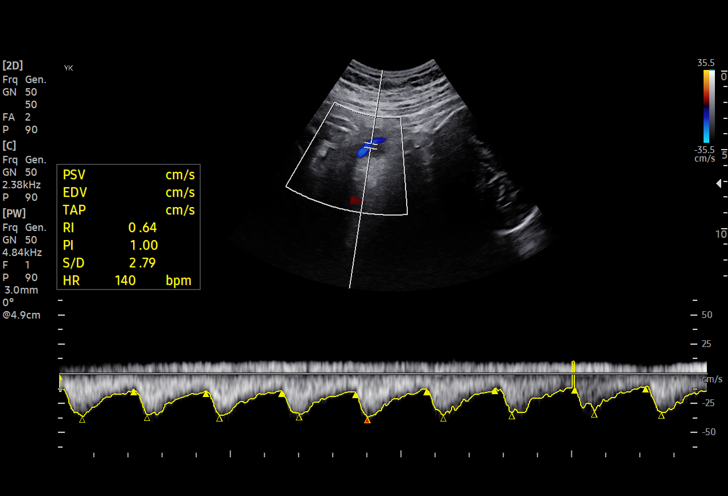

[14 of 28 positions shown; findings below may reference images not displayed]

Road; [HOSPITAL]

Indications

 Encounter for fetal growth retardation
 Obesity complicating pregnancy, third
 trimester
 34 weeks gestation of pregnancy
Fetal Evaluation

 Num Of Fetuses:         1
 Fetal Heart Rate(bpm):  155
 Cardiac Activity:       Observed
 Presentation:           Cephalic
 Placenta:               Left lateral
 P. Cord Insertion:      Visualized, central

 Amniotic Fluid
 AFI FV:      Within normal limits

 AFI Sum(cm)     %Tile       Largest Pocket(cm)
 10.71           26

 RUQ(cm)       RLQ(cm)       LUQ(cm)        LLQ(cm)
 1.22          3.61          2.89           3
Biometry

 BPD:      84.8  mm     G. Age:  34w 1d         31  %    CI:        79.22   %    70 - 86
                                                         FL/HC:      21.9   %    20.1 -
 HC:      301.2  mm     G. Age:  33w 3d        2.3  %    HC/AC:      1.04        0.93 -
 AC:      290.3  mm     G. Age:  33w 0d         11  %    FL/BPD:     77.8   %    71 - 87
 FL:         66  mm     G. Age:  34w 0d         21  %    FL/AC:      22.7   %    20 - 24
 Est. FW:    7611  gm    4 lb 14 oz      13  %
OB History

 Gravidity:    1
Gestational Age

 Clinical EDD:  34w 6d                                        EDD:   04/14/20
 U/S Today:     33w 5d                                        EDD:   04/22/20
 Best:          34w 6d     Det. By:  Clinical EDD             EDD:   04/14/20
Anatomy

 Cranium:               Appears normal         Aortic Arch:            Previously seen
 Cavum:                 Appears normal         Ductal Arch:            Previously seen
 Ventricles:            Appears normal         Diaphragm:              Previously seen
 Choroid Plexus:        Previously seen        Stomach:                Appears normal, left
                                                                       sided
 Cerebellum:            Previously seen        Abdomen:                Appears normal
 Posterior Fossa:       Previously seen        Abdominal Wall:         Previously seen
 Nuchal Fold:           Not applicable (>20    Cord Vessels:           Previously seen
                        wks GA)
 Face:                  Orbits and profile     Kidneys:                Appear normal
                        previously seen
 Lips:                  Previously seen        Bladder:                Appears normal
 Thoracic:              Appears normal         Spine:                  Previously seen
 Heart:                 Previously seen        Upper Extremities:      Previously seen
 RVOT:                  Previously seen        Lower Extremities:      Previously seen
 LVOT:                  Previously seen

 Other:  Nasal bone, open hands,  3VV and 3VTV previously visualized.
         Technicallly difficult due to advanced GA and maternal habitus.
Comments

 This patient was seen for a follow up growth scan due to
 borderline fetal growth restriction that was noted during her
 prior ultrasound exams.  The patient denies any problems
 since her last exam and reports that she has screened
 negative for gestational diabetes in her current pregnancy.
 She was informed that the fetal growth measured today is in
 the lower normal range (13th percentile) for her gestational
 age.  There was normal amniotic fluid noted.
 Doppler studies of the umbilical artery continues to show
 normal forward flow.  There were no signs of absent or
 reversed end-diastolic flow noted today.
 As the overall EFW measures in the lower normal range, a
 follow-up growth scan was scheduled in 3 weeks.
 The patient understands that should the EFW measure at
 less than the 10th percentile during her next ultrasound
 exam, that delivery at between 37 to 38 weeks may be
 recommended.

## 2022-02-15 ENCOUNTER — Encounter (INDEPENDENT_AMBULATORY_CARE_PROVIDER_SITE_OTHER): Payer: Self-pay

## 2022-09-20 ENCOUNTER — Institutional Professional Consult (permissible substitution): Payer: Medicaid Other | Admitting: Plastic Surgery

## 2022-10-25 ENCOUNTER — Encounter: Payer: Self-pay | Admitting: Plastic Surgery

## 2022-10-25 ENCOUNTER — Ambulatory Visit (INDEPENDENT_AMBULATORY_CARE_PROVIDER_SITE_OTHER): Payer: Medicaid Other | Admitting: Plastic Surgery

## 2022-10-25 VITALS — BP 155/88 | HR 75 | Ht 67.0 in | Wt 283.8 lb

## 2022-10-25 DIAGNOSIS — N62 Hypertrophy of breast: Secondary | ICD-10-CM

## 2022-10-25 DIAGNOSIS — M542 Cervicalgia: Secondary | ICD-10-CM

## 2022-10-25 DIAGNOSIS — M546 Pain in thoracic spine: Secondary | ICD-10-CM | POA: Diagnosis not present

## 2022-10-25 DIAGNOSIS — Z6841 Body Mass Index (BMI) 40.0 and over, adult: Secondary | ICD-10-CM | POA: Diagnosis not present

## 2022-10-25 NOTE — Progress Notes (Signed)
Referring Provider Fleet Contras, MD [address] [place],  [place] [address]   CC:  Chief Complaint  Patient presents with   Advice Only      Joanne Harper is an 27 y.o. female.  HPI: Ms. Golz is a 27 year old female who presents today with a request for a breast reduction.  Patient states that she has upper back and neck pain which has been present probably since high school when she started developing breasts but has been exacerbated over the past 2 years with the birth of her daughter.  She is interested in surgical reduction in the size of her breast.  Allergies  Allergen Reactions   Penicillins Hives    Outpatient Encounter Medications as of 10/25/2022  Medication Sig   aspirin EC 81 MG tablet Take 81 mg by mouth daily.   ibuprofen (ADVIL) 600 MG tablet Take 1 tablet (600 mg total) by mouth every 6 (six) hours as needed.   NIFEdipine (PROCARDIA XL) 60 MG 24 hr tablet Take 1 tablet (60 mg total) by mouth daily.   Vitamin D, Ergocalciferol, (DRISDOL) 1.25 MG (50000 UNIT) CAPS capsule Take 50,000 Units by mouth once a week.   No facility-administered encounter medications on file as of 10/25/2022.     Past Medical History:  Diagnosis Date   Medical history non-contributory    Pregnancy induced hypertension    Vaginal Pap smear, abnormal     Past Surgical History:  Procedure Laterality Date   TONSILLECTOMY     WISDOM TOOTH EXTRACTION      Family History  Problem Relation Age of Onset   Cancer Mother    Depression Mother    Anxiety disorder Mother    Hypertension Mother    Diabetes Father    Hypertension Father     Social History   Social History Narrative   Not on file     Review of Systems General: Denies fevers, chills, weight loss CV: Denies chest pain, shortness of breath, palpitations Breast: Large pendulous breasts which are causing upper back and neck pain  Physical Exam    10/25/2022    9:28 AM 03/27/2020    4:10 AM 03/26/2020    11:18 PM  Vitals with BMI  Height     Weight 283 lbs 13 oz    BMI 44.44    Systolic 155 130 604  Diastolic 88 71 55  Pulse 75 87 107    General:  No acute distress,  Alert and oriented, Non-Toxic, Normal speech and affect Breast: Patient has very large pendulous breast with grade 3 ptosis.  The breasts are very heavy. Mammogram: Not applicable Assessment/Plan Symptomatic macromastia: Ms. Heck does indeed have very large breasts.  On evaluation I can remove 800 g to 900 g possibly 1000 g per breast.  Based on her BSA she will require reduction of 1400 g to 1500 g.  I do not think that I can remove this at at this time.  I have discussed this at length with her.  She has been diligent with her weight loss having lost close to 50 pounds over the past year.  She still has a goal of another 30 pounds.  It is likely that when she reaches this goal that her BSA will decrease enough to where I can meet the required reduction volume.  We did discuss breast reductions at length today.  I showed her where the incisions were and we talked about the unpredictable nature of scarring.  We talked about the risks of bleeding, infection, seroma formation.  She understands I will use drains postoperatively.  We did discuss the risks of nipple loss due to ischemia.  We discussed the possibility of difficulty with breast-feeding and difficulty with mammogram interpretation after breast reduction.  She understands the postoperative restrictions will include no heavy lifting greater than 20 pounds, no vigorous activity, no submerging the incisions in water for 6 weeks. She will return to see me at the beginning or middle of June at which time I will submit her for a breast reduction and consideration by the insurance company.  Measurements and photographs will be obtained at that time.  Santiago Glad 10/25/2022, 9:57 AM

## 2022-12-12 ENCOUNTER — Other Ambulatory Visit: Payer: Self-pay | Admitting: Internal Medicine

## 2022-12-14 LAB — CBC
HCT: 35.2 % (ref 35.0–45.0)
Hemoglobin: 10.7 g/dL — ABNORMAL LOW (ref 11.7–15.5)
MCH: 21.7 pg — ABNORMAL LOW (ref 27.0–33.0)
MCHC: 30.4 g/dL — ABNORMAL LOW (ref 32.0–36.0)
MCV: 71.4 fL — ABNORMAL LOW (ref 80.0–100.0)
MPV: 10.4 fL (ref 7.5–12.5)
Platelets: 304 10*3/uL (ref 140–400)
RBC: 4.93 10*6/uL (ref 3.80–5.10)
RDW: 15.6 % — ABNORMAL HIGH (ref 11.0–15.0)
WBC: 6.3 10*3/uL (ref 3.8–10.8)

## 2022-12-14 LAB — COMPLETE METABOLIC PANEL WITH GFR
AG Ratio: 1.4 (calc) (ref 1.0–2.5)
ALT: 11 U/L (ref 6–29)
AST: 13 U/L (ref 10–30)
Albumin: 4 g/dL (ref 3.6–5.1)
Alkaline phosphatase (APISO): 71 U/L (ref 31–125)
BUN: 13 mg/dL (ref 7–25)
CO2: 23 mmol/L (ref 20–32)
Calcium: 9.3 mg/dL (ref 8.6–10.2)
Chloride: 104 mmol/L (ref 98–110)
Creat: 0.88 mg/dL (ref 0.50–0.96)
Globulin: 2.9 g/dL (calc) (ref 1.9–3.7)
Glucose, Bld: 106 mg/dL — ABNORMAL HIGH (ref 65–99)
Potassium: 4.3 mmol/L (ref 3.5–5.3)
Sodium: 138 mmol/L (ref 135–146)
Total Bilirubin: 0.7 mg/dL (ref 0.2–1.2)
Total Protein: 6.9 g/dL (ref 6.1–8.1)
eGFR: 92 mL/min/{1.73_m2} (ref >=60)

## 2022-12-14 LAB — IRON, TOTAL/TOTAL IRON BINDING CAP
%SAT: 22 % (calc) (ref 16–45)
Iron: 95 ug/dL (ref 40–190)
TIBC: 426 mcg/dL (calc) (ref 250–450)

## 2022-12-14 LAB — LIPID PANEL
Cholesterol: 139 mg/dL (ref <200)
HDL: 46 mg/dL — ABNORMAL LOW (ref >=50)
LDL Cholesterol (Calc): 78 mg/dL (calc)
Non-HDL Cholesterol (Calc): 93 mg/dL (calc) (ref <130)
Total CHOL/HDL Ratio: 3 (calc) (ref <5.0)
Triglycerides: 66 mg/dL (ref <150)

## 2022-12-14 LAB — TSH: TSH: 1.97 mIU/L

## 2022-12-14 LAB — B12 AND FOLATE PANEL
Folate: 9.1 ng/mL
Vitamin B-12: 290 pg/mL (ref 200–1100)

## 2022-12-14 LAB — FERRITIN: Ferritin: 13 ng/mL — ABNORMAL LOW (ref 16–154)

## 2022-12-14 LAB — VITAMIN D 25 HYDROXY (VIT D DEFICIENCY, FRACTURES): Vit D, 25-Hydroxy: 34 ng/mL (ref 30–100)

## 2023-01-24 ENCOUNTER — Ambulatory Visit: Payer: Medicaid Other | Admitting: Plastic Surgery

## 2024-06-27 ENCOUNTER — Encounter: Payer: Self-pay | Admitting: *Deleted

## 2024-06-27 ENCOUNTER — Ambulatory Visit
Admission: EM | Admit: 2024-06-27 | Discharge: 2024-06-27 | Disposition: A | Discharge status: Home / Self Care | Attending: Emergency Medicine | Admitting: Emergency Medicine

## 2024-06-27 DIAGNOSIS — J020 Streptococcal pharyngitis: Secondary | ICD-10-CM

## 2024-06-27 LAB — POCT RAPID STREP A (OFFICE): Rapid Strep A Screen: POSITIVE — AB

## 2024-06-27 MED ORDER — CEFIXIME 400 MG PO CAPS: 400.0000 mg | ORAL_CAPSULE | Freq: Every day | ORAL | 0 refills | Status: AC

## 2024-06-27 MED ORDER — DEXAMETHASONE 6 MG PO TABS
10.0000 mg | ORAL_TABLET | Freq: Once | ORAL | Status: AC
  Administered 2024-06-27: 10 mg via ORAL

## 2024-06-27 NOTE — ED Provider Notes (Signed)
 " [facility]    CSN: [id number] Arrival date & time: 06/27/24  1400      History   Chief Complaint Chief Complaint  Patient presents with   Otalgia   Sore Throat    HPI Joanne Harper is a 28 y.o. female.  Patient without significant medical history presents to the emergency department today with concerns of sore throat and ear pain.  Reports that she has been experiencing sore throat with right-sided ear pain over the last several days.  States that she first noticed her symptoms while she was traveling through [place]  and noticed that while she was traveling to the mountains had increased ear popping.  Denies any fever, chills or bodyaches.  Reports ongoing pain to the right ear.  No sick contacts as far she is aware.   Otalgia Associated symptoms: sore throat   Sore Throat    Past Medical History:  Diagnosis Date   Medical history non-contributory    Pregnancy induced hypertension    Vaginal Pap smear, abnormal     Patient Active Problem List   Diagnosis Date Noted   Preeclampsia 03/24/2020   SVD (spontaneous vaginal delivery) 03/24/2020   Gestational hypertension 03/22/2020    Past Surgical History:  Procedure Laterality Date   TONSILLECTOMY     WISDOM TOOTH EXTRACTION      OB History     Gravida  1   Para  1   Term  1   Preterm      AB      Living  1      SAB      IAB      Ectopic      Multiple  0   Live Births  1            Home Medications    Prior to Admission medications  Medication Sig Start Date End Date Taking? Authorizing Provider  aspirin EC 81 MG tablet Take 81 mg by mouth daily.   Yes [provider]  cefixime (SUPRAX) 400 MG CAPS capsule Take 1 capsule (400 mg total) by mouth daily for 10 days. 06/27/24 07/07/24 Yes Shubh Chiara A, PA-C  ibuprofen  (ADVIL ) 600 MG tablet Take 1 tablet (600 mg total) by mouth every 6 (six) hours as needed. 03/27/20  Yes Okey Leader, MD  NIFEdipine  (PROCARDIA  XL)  60 MG 24 hr tablet Take 1 tablet (60 mg total) by mouth daily. 03/27/20  Yes Okey Leader, MD  Vitamin D , Ergocalciferol , (DRISDOL) 1.25 MG (50000 UNIT) CAPS capsule Take 50,000 Units by mouth once a week. 09/01/22  Yes [provider]    Family History Family History  Problem Relation Age of Onset   Cancer Mother    Depression Mother    Anxiety disorder Mother    Hypertension Mother    Diabetes Father    Hypertension Father     Social History Social History[1]   Allergies   Penicillins   Review of Systems Review of Systems  HENT:  Positive for ear pain and sore throat.   All other systems reviewed and are negative.    Physical Exam Triage Vital Signs ED Triage Vitals  Encounter Vitals Group     BP 06/27/24 1511 (!) 154/82     Girls Systolic BP Percentile --      Girls Diastolic BP Percentile --      Boys Systolic BP Percentile --      Boys Diastolic BP Percentile --  Pulse Rate 06/27/24 1511 80     Resp 06/27/24 1511 18     Temp 06/27/24 1511 98.1 F (36.7 C)     Temp Source 06/27/24 1511 Oral     SpO2 06/27/24 1511 97 %     Weight --      Height --      Head Circumference --      Peak Flow --      Pain Score 06/27/24 1509 8     Pain Loc --      Pain Education --      Exclude from Growth Chart --    No data found.  Updated Vital Signs BP (!) 154/82 (BP Location: Left Arm)   Pulse 80   Temp 98.1 F (36.7 C) (Oral)   Resp 18   LMP 06/24/2024 (Exact Date)   SpO2 97%   Breastfeeding No   Visual Acuity Right Eye Distance:   Left Eye Distance:   Bilateral Distance:    Right Eye Near:   Left Eye Near:    Bilateral Near:     Physical Exam Vitals and nursing note reviewed.  Constitutional:      General: She is not in acute distress.    Appearance: She is well-developed.  HENT:     Head: Normocephalic and atraumatic.     Right Ear: Ear canal normal. Tympanic membrane is erythematous.     Left Ear: Tympanic membrane and ear canal  normal.     Mouth/Throat:     Pharynx: Uvula midline. Posterior oropharyngeal erythema present. No pharyngeal swelling or oropharyngeal exudate.     Tonsils: No tonsillar exudate or tonsillar abscesses.     Comments: Oropharyngeal erythema is present but tonsils are surgically absent. Eyes:     Conjunctiva/sclera: Conjunctivae normal.  Cardiovascular:     Rate and Rhythm: Normal rate and regular rhythm.     Heart sounds: No murmur heard. Pulmonary:     Effort: Pulmonary effort is normal. No respiratory distress.     Breath sounds: Normal breath sounds.  Abdominal:     Palpations: Abdomen is soft.     Tenderness: There is no abdominal tenderness.  Musculoskeletal:        General: No swelling.     Cervical back: Neck supple.  Skin:    General: Skin is warm and dry.     Capillary Refill: Capillary refill takes less than 2 seconds.  Neurological:     Mental Status: She is alert.  Psychiatric:        Mood and Affect: Mood normal.      UC Treatments / Results  Labs (all labs ordered are listed, but only abnormal results are displayed) Labs Reviewed  POCT RAPID STREP A (OFFICE) - Abnormal; Notable for the following components:      Result Value   Rapid Strep A Screen Positive (*)    All other components within normal limits    EKG   Radiology No results found.  Procedures Procedures (including critical care time)  Medications Ordered in UC Medications  dexamethasone (DECADRON) tablet 10 mg (has no administration in time range)    Initial Impression / Assessment and Plan / UC Course  I have reviewed the triage vital signs and the nursing notes.  Pertinent labs & imaging results that were available during my care of the patient were reviewed by me and considered in my medical decision making (see chart for details).     This patient presents to  the UC for concern of sore throat, ear pain.  Differential diagnosis includes viral pharyngitis, streptococcal  pharyngitis, URI    Additional history obtained:  Additional history obtained from chart review   Lab Tests:  I Ordered, and personally interpreted labs.  The pertinent results include: Group A strep positive   Medicines ordered and prescription drug management:  I ordered medication including Decadron for pharyngitis Reevaluation of the patient after these medicines showed that the patient improved I have reviewed the patients home medicines and have made adjustments as needed   Problem List /UC course:  Patient without significant medical history presents to urgent care today with concerns of sore throat and ear pain.  Reports symptoms started several days ago traveling across [place] .  States that she continues to have a sore throat and right-sided ear pain.  No reported fever, chills or bodyaches.  Denies any sick contacts. On exam, patient has oropharyngeal erythema but no appreciable tonsillar exudate as tonsils surgically absent.  The right ear is erythematous but no appreciable bulging, or fluid behind the eardrum. Given symptoms and presentation, group A strep obtained. Group A strep is positive.  Suspect symptoms are likely referred pain from pharyngitis in the right ear.  No obvious signs of acute otitis media. Will start patient on a course of antibiotics after initial steroid dose given here at urgent care.  Return precautions discussed.  She is otherwise stable for outpatient follow-up and discharged home.   Social Determinants of Health:  None  Final Clinical Impressions(s) / UC Diagnoses   Final diagnoses:  Strep pharyngitis     Discharge Instructions      You were seen in urgent care today for concerns of a sore throat and ear pain.  You are found to have a positive strep test indicating that you likely have strep throat.  You are started on antibiotic therapy and steroids to help with this.  Please take these as prescribed.  For any concerns of new or  worsening symptoms, return to urgent care.     ED Prescriptions     Medication Sig Dispense Auth. Provider   cefixime (SUPRAX) 400 MG CAPS capsule Take 1 capsule (400 mg total) by mouth daily for 10 days. 10 capsule Saman Umstead A, PA-C      PDMP not reviewed this encounter.    [1]  Social History Tobacco Use   Smoking status: Never   Smokeless tobacco: Never  Vaping Use   Vaping status: Never Used  Substance Use Topics   Alcohol use: No   Drug use: No     Amenah Tucci A, PA-C 06/27/24 1618  "

## 2024-06-27 NOTE — Discharge Instructions (Addendum)
 You were seen in urgent care today for concerns of a sore throat and ear pain.  You are found to have a positive strep test indicating that you likely have strep throat.  You are started on antibiotic therapy and steroids to help with this.  Please take these as prescribed.  For any concerns of new or worsening symptoms, return to urgent care.

## 2024-06-27 NOTE — ED Triage Notes (Addendum)
 Pt reports she traveled from [place]  by on Sunday. States after going through the mountains her ears began popping and she has had a sore throat since then. Right ear is painful still. Taking OTC meds without relief. No fever. Also has a cough and runny nose.

## 2024-06-28 ENCOUNTER — Ambulatory Visit: Payer: Self-pay
